# Patient Record
Sex: Male | Born: 1980 | Race: White | Hispanic: No | Marital: Married | Smoking: Never smoker
Health system: Southern US, Community
[De-identification: ages and names within clinical notes are randomized; demographics above are authoritative.]

## PROBLEM LIST (undated history)

## (undated) DIAGNOSIS — G932 Benign intracranial hypertension: Secondary | ICD-10-CM

## (undated) DIAGNOSIS — E782 Mixed hyperlipidemia: Secondary | ICD-10-CM

## (undated) DIAGNOSIS — I10 Essential (primary) hypertension: Secondary | ICD-10-CM

## (undated) DIAGNOSIS — I4891 Unspecified atrial fibrillation: Secondary | ICD-10-CM

## (undated) DIAGNOSIS — M25559 Pain in unspecified hip: Secondary | ICD-10-CM

## (undated) HISTORY — DX: Mixed hyperlipidemia: E78.2

## (undated) HISTORY — DX: Essential (primary) hypertension: I10

## (undated) HISTORY — DX: Benign intracranial hypertension: G93.2

## (undated) HISTORY — DX: Pain in unspecified hip: M25.559

## (undated) HISTORY — DX: Unspecified atrial fibrillation: I48.91

---

## 2010-10-28 ENCOUNTER — Emergency Department (HOSPITAL_COMMUNITY)
Admission: EM | Admit: 2010-10-28 | Discharge: 2010-10-28 | Payer: Self-pay | Source: Home / Self Care | Admitting: Emergency Medicine

## 2011-01-06 LAB — POCT I-STAT, CHEM 8
HCT: 50 % (ref 39.0–52.0)
Hemoglobin: 17 g/dL (ref 13.0–17.0)
Potassium: 4.3 mEq/L (ref 3.5–5.1)
Sodium: 138 mEq/L (ref 135–145)

## 2011-04-21 ENCOUNTER — Emergency Department (HOSPITAL_COMMUNITY)
Admission: EM | Admit: 2011-04-21 | Discharge: 2011-04-21 | Disposition: A | Discharge status: Home / Self Care | Payer: Managed Care, Other (non HMO) | Attending: Emergency Medicine | Admitting: Emergency Medicine

## 2011-04-21 DIAGNOSIS — R079 Chest pain, unspecified: Secondary | ICD-10-CM | POA: Insufficient documentation

## 2011-04-21 DIAGNOSIS — Z79899 Other long term (current) drug therapy: Secondary | ICD-10-CM | POA: Insufficient documentation

## 2011-04-21 DIAGNOSIS — Z7982 Long term (current) use of aspirin: Secondary | ICD-10-CM | POA: Insufficient documentation

## 2011-04-21 DIAGNOSIS — R000 Tachycardia, unspecified: Secondary | ICD-10-CM | POA: Insufficient documentation

## 2011-04-21 DIAGNOSIS — I4891 Unspecified atrial fibrillation: Secondary | ICD-10-CM | POA: Insufficient documentation

## 2011-04-21 DIAGNOSIS — R002 Palpitations: Secondary | ICD-10-CM | POA: Insufficient documentation

## 2014-02-20 ENCOUNTER — Encounter: Payer: Self-pay | Admitting: Neurology

## 2014-02-21 ENCOUNTER — Encounter: Payer: Self-pay | Admitting: Neurology

## 2014-02-21 ENCOUNTER — Encounter (INDEPENDENT_AMBULATORY_CARE_PROVIDER_SITE_OTHER): Payer: Self-pay

## 2014-02-21 ENCOUNTER — Ambulatory Visit (INDEPENDENT_AMBULATORY_CARE_PROVIDER_SITE_OTHER): Payer: BC Managed Care – PPO | Admitting: Neurology

## 2014-02-21 VITALS — BP 135/76 | HR 78 | Temp 98.7°F | Ht 70.5 in | Wt 290.0 lb

## 2014-02-21 DIAGNOSIS — R0683 Snoring: Secondary | ICD-10-CM

## 2014-02-21 DIAGNOSIS — M549 Dorsalgia, unspecified: Secondary | ICD-10-CM

## 2014-02-21 DIAGNOSIS — F40298 Other specified phobia: Secondary | ICD-10-CM

## 2014-02-21 DIAGNOSIS — E669 Obesity, unspecified: Secondary | ICD-10-CM

## 2014-02-21 DIAGNOSIS — G932 Benign intracranial hypertension: Secondary | ICD-10-CM

## 2014-02-21 DIAGNOSIS — R0609 Other forms of dyspnea: Secondary | ICD-10-CM

## 2014-02-21 DIAGNOSIS — R0989 Other specified symptoms and signs involving the circulatory and respiratory systems: Secondary | ICD-10-CM

## 2014-02-21 DIAGNOSIS — I4891 Unspecified atrial fibrillation: Secondary | ICD-10-CM

## 2014-02-21 DIAGNOSIS — I480 Paroxysmal atrial fibrillation: Secondary | ICD-10-CM

## 2014-02-21 DIAGNOSIS — H538 Other visual disturbances: Secondary | ICD-10-CM

## 2014-02-21 DIAGNOSIS — H4710 Unspecified papilledema: Secondary | ICD-10-CM

## 2014-02-21 DIAGNOSIS — F40240 Claustrophobia: Secondary | ICD-10-CM

## 2014-02-21 NOTE — Patient Instructions (Addendum)
You may have a condition called pseudotumor cerebri, which means that there increased fluid pressure around your brain. 1. We will add a orbital specific MRI with and without contrast. Down the road we may consider a MRV and MRA, but given a normal looking brain MRI, the concern low for any other issues.  2. You have already seen Dr. Reginal Lutesharles Richards.   3. We will do a LP with pressure testing and routine fluid testing.  4. We may consider a medication to help keep your spinal fluid pressure at bay.  The most serious complication of having pseudotumor cerebri is loss of vision which can be permanent. 5. Based on your symptoms and your exam I believe you are at risk for obstructive sleep apnea or OSA, and I think we should proceed with a sleep study to determine whether you do or do not have OSA and how severe it is. If you have more than mild OSA, I want you to consider treatment with CPAP. Please remember, the risks and ramifications of moderate to severe obstructive sleep apnea or OSA are: Cardiovascular disease, including congestive heart failure, stroke, difficult to control hypertension, arrhythmias, and even type 2 diabetes has been linked to untreated OSA. Sleep apnea causes disruption of sleep and sleep deprivation in most cases, which, in turn, can cause recurrent headaches, problems with memory, mood, concentration, focus, and vigilance. Most people with untreated sleep apnea report excessive daytime sleepiness, which can affect their ability to drive. Please do not drive if you feel sleepy.  I will see you back after your sleep study to go over all the test results and where to go from there. We will call you after your sleep study and to set up an appointment at the time.

## 2014-02-21 NOTE — Progress Notes (Signed)
Subjective:    Patient ID: Clayton Myers is a 33 y.o. male.  HPI    Huston Foley, MD, PhD [facility] [address], [address] [address] [place], [place] [address]  Dear Dr. Clovis Riley,   I saw your patient, Clayton Myers, upon your kind request in my neurologic clinic today for initial consultation of his recurrent headaches, concern for pseudotumor cerebri. The patient is unaccompanied today. As you know, Clayton Myers is a 33 year old right-handed gentleman with an underlying medical history of obesity, hypertension and a remote Hx of PAF, s/p L shoulder arthroscopic surgery in 10/14, who has been noted to have papilledema by Dr. Reginal Lutes, ophthalmologist, who then ordered a MRI brain. He had an MRI with and without contrast on 02/07/2014 which showed no definitive acute intracranial abnormality, mildly expanded empty sella which can be seen with idiopathic intracranial hypertension/pseudotumor cerebri. He states that during a recent routine eye exam he was told that he had a swollen ocular nerve on the left and was sent to Dr. Peggye Pitt for further evaluation.  He has had some blurry vision for the last 6 months, no loss of vision.  Never had TIA or stroke symptoms, denying sudden onset of one sided weakness, numbness, tingling, slurring of speech or droopy face, hearing loss, tinnitus, diplopia or visual field cut or monocular loss of vision, and denies recurrent headaches.   He does not wake with HAs. He was diagnosed with PAF in 7/11 and was hospitalized for about 5 days. He does not sleep well, in that he has trouble achieving and he has had some recent mid back pain. He did some extra yard work recently. He snores, but denies gasping for air. At the time of his PAF Dx, he was much heavier and was told to have a sleep study. He does have palpitations every few months. No Hx of CP, no FHx of OSA or Afib. He denies EDS, and his ESS is /24. His BT is 10-11 PM,  but it takes him about 1-2 hours to go to sleep. He has to get up at 5 AM and usually goes to the gym early morning.   His Past Medical History Is Significant For: Past Medical History  Diagnosis Date  . Essential hypertension, benign   . Atrial fibrillation   . Mixed hyperlipidemia   . Pain in joint, pelvic region and thigh     His Past Surgical History Is Significant For: No past surgical history on file.  His Family History Is Significant For: Family History  Problem Relation Age of Onset  . Cancer Paternal Grandfather   . Cancer Maternal Grandfather   . Osteoarthritis Father     His Social History Is Significant For: History   Social History  . Marital Status: Married    Spouse Name: N/A    Number of Children: N/A  . Years of Education: N/A   Social History Main Topics  . Smoking status: Never Smoker   . Smokeless tobacco: None  . Alcohol Use: No  . Drug Use: No  . Sexual Activity: None   Other Topics Concern  . None   Social History Narrative  . None    His Allergies Are:  No Known Allergies:   His Current Medications Are:  Outpatient Encounter Prescriptions as of 02/21/2014  Medication Sig  . aspirin 81 MG tablet Take 81 mg by mouth daily.  . metoprolol succinate (TOPROL-XL) 50 MG 24 hr tablet Take 50 mg by  mouth daily. Take with or immediately following a meal.  :   Review of Systems:  Out of a complete 14 point review of systems, all are reviewed and negative with the exception of these symptoms as listed below:  Review of Systems  Constitutional: Negative.   HENT: Positive for rhinorrhea and tinnitus.   Eyes: Positive for visual disturbance (blurred vision).  Respiratory: Negative.   Cardiovascular: Negative.   Gastrointestinal: Negative.   Endocrine: Negative.   Genitourinary: Negative.   Musculoskeletal: Positive for myalgias.  Skin: Negative.   Allergic/Immunologic: Positive for environmental allergies and immunocompromised state  (frequent infections).  Neurological: Positive for dizziness and numbness.       Memory loss  Hematological: Negative.   Psychiatric/Behavioral: Positive for sleep disturbance (e.d.s.).    Objective:  Neurologic Exam  Physical Exam Physical Examination:   Filed Vitals:   02/21/14 0832  BP: 135/76  Pulse: 78  Temp: 98.7 F (37.1 C)    General Examination: The patient is a very pleasant 33 y.o. male in no acute distress. He appears well-developed and well-nourished and well groomed.   HEENT: Normocephalic, atraumatic, pupils are equal, round and reactive to light and accommodation. Funduscopic exam shows possible mild papilledema on the R and mild papilledema on the L. Visual fields are full by finger perimetry. Corrected visual acuity with contacts in place was 20/20 on the right and 20/30 on the left. Extraocular tracking is good without limitation to gaze excursion or nystagmus noted. Normal smooth pursuit is noted. Hearing is grossly intact. Tympanic membranes are clear bilaterally. Face is symmetric with normal facial animation and normal facial sensation. Speech is clear with no dysarthria noted. There is no hypophonia. There is no lip, neck/head, jaw or voice tremor. Neck is supple with full range of passive and active motion. There are no carotid bruits on auscultation. Oropharynx exam reveals: mild mouth dryness, good dental hygiene and moderate airway crowding, due to narrow airway entry and tonsils in place. Mallampati is class I. Tongue protrudes centrally and palate elevates symmetrically. Tonsils are 2+ in size. Neck size is 19 inches.   Chest: Clear to auscultation without wheezing, rhonchi or crackles noted.  Heart: S1+S2+0, regular and normal without murmurs, rubs or gallops noted.   Abdomen: Soft, non-tender and non-distended with normal bowel sounds appreciated on auscultation.  Extremities: There is no pitting edema in the distal lower extremities bilaterally. Pedal  pulses are intact.  Skin: Warm and dry without trophic changes noted. There are no varicose veins.  Musculoskeletal: exam reveals no obvious joint deformities, tenderness or joint swelling or erythema.   Neurologically:  Mental status: The patient is awake, alert and oriented in all 4 spheres. His immediate and remote memory, attention, language skills and fund of knowledge are appropriate. There is no evidence of aphasia, agnosia, apraxia or anomia. Speech is clear with normal prosody and enunciation. Thought process is linear. Mood is normal and affect is normal.  Cranial nerves II - XII are as described above under HEENT exam. In addition: shoulder shrug is normal with equal shoulder height noted. Motor exam: Normal bulk, strength and tone is noted. There is no drift, tremor or rebound. Romberg is negative. Reflexes are 2+ throughout. Babinski: Toes are flexor bilaterally. Fine motor skills and coordination: intact with normal finger taps, normal hand movements, normal rapid alternating patting, normal foot taps and normal foot agility.  Cerebellar testing: No dysmetria or intention tremor on finger to nose testing. Heel to shin is unremarkable bilaterally.  There is no truncal or gait ataxia.  Sensory exam: intact to light touch, pinprick, vibration, temperature sense and proprioception in the upper and lower extremities.  Gait, station and balance: He stands with mild pain in the mid to lower back area reported. No veering to one side is noted. No leaning to one side is noted. Posture is age-appropriate and stance is narrow based. Gait shows normal stride length and normal pace. No problems turning are noted. He turns en bloc. Tandem walk is unremarkable. Intact toe and heel stance is noted.               Assessment and Plan:  In summary, Clayton Myers is a very pleasantLadona Myers 33 y.o.-year old male with a history of hypertension and obesity, who complains of blurry vision and was recently found to  have papilledema on the left. On examination he has a normal neurological exam with the exception of papilledema on the L and possibly to a milder degree on the R. His recent MRI brain with and without contrast showed a mildly expanded empty sella, a finding which can be seen in the context of pseudotumor cerebri. Given his constellation of physical findings, his symptoms, his imaging findings and his history there is indeed concerned that he has pseudotumor cerebri. Confirmation of increased intracranial pressure can only be obtained with a spinal tap with opening pressure measurement. I explained a lumbar puncture to him in detail and explained the test to the patient and his wife today. Otherwise, I think there is also concern for underlying sleep apnea not because he has a telltale history but because he reports snoring, has a larger next size, has a tighter airway, and has a history of PAF. At this moment, I would like for him to have a dedicated orbital MRI with and without contrast. I think we can hold off on MRV and MRA testing of his head because of a normal-looking MRI otherwise. I also think the concern for demyelinating disease is rather low given his otherwise benign history and again a normal-looking MRI with and without contrast. Sleep apnea has been linked into pseudotumor cerebri as you know. I would like for him to have a sleep study as well. We will schedule him for stress and I will see him back afterwards. Today also explained to him that there are symptomatic medications we can utilize down the road to help keep his spinal fluid pressure on the lower side or at bay. I talked to him about potentially using Diamox or Topamax in the near future. We will await the spinal fluid test results. Given his history of claustrophobia I provided him with some Xanax for his MRI testing and for his lumbar puncture test.   I answered all their questions today and the patient and his wife were in agreement  with the above outlined plan. He has an appointment with Dr. Peggye Pittichards coming up soon and may for recheck and I think he needs to be seen regularly to follow the progress of his eye exam.  Thank you very much for allowing me to participate in the care of this nice patient. If I can be of any further assistance to you please do not hesitate to call me at [phone].  Sincerely,   Huston FoleySaima Huxton Glaus, MD, PhD

## 2014-02-25 ENCOUNTER — Ambulatory Visit
Admission: RE | Admit: 2014-02-25 | Discharge: 2014-02-25 | Disposition: A | Discharge status: Home / Self Care | Payer: BC Managed Care – PPO | Source: Ambulatory Visit | Attending: Neurology | Admitting: Neurology

## 2014-02-25 DIAGNOSIS — R0683 Snoring: Secondary | ICD-10-CM

## 2014-02-25 DIAGNOSIS — G932 Benign intracranial hypertension: Secondary | ICD-10-CM

## 2014-02-25 DIAGNOSIS — E669 Obesity, unspecified: Secondary | ICD-10-CM

## 2014-02-25 DIAGNOSIS — R51 Headache: Secondary | ICD-10-CM

## 2014-02-25 DIAGNOSIS — M549 Dorsalgia, unspecified: Secondary | ICD-10-CM

## 2014-02-25 DIAGNOSIS — H4710 Unspecified papilledema: Secondary | ICD-10-CM

## 2014-02-25 DIAGNOSIS — I480 Paroxysmal atrial fibrillation: Secondary | ICD-10-CM

## 2014-02-25 DIAGNOSIS — H538 Other visual disturbances: Secondary | ICD-10-CM

## 2014-02-25 MED ORDER — GADOBENATE DIMEGLUMINE 529 MG/ML IV SOLN
20.0000 mL | Freq: Once | INTRAVENOUS | Status: AC | PRN
  Administered 2014-02-25: 20 mL via INTRAVENOUS

## 2014-02-28 ENCOUNTER — Telehealth: Payer: Self-pay | Admitting: *Deleted

## 2014-02-28 NOTE — Telephone Encounter (Signed)
Patient calling to get MRI results. Thanks °

## 2014-02-28 NOTE — Telephone Encounter (Addendum)
I called pt with results of MRI after Dr. Frances FurbishAthar consulted.  Pt scheduled for LP.  He verbalized understanding.

## 2014-02-28 NOTE — Telephone Encounter (Signed)
Patient calling again to ask about MRI results, please return call and advise.

## 2014-03-02 ENCOUNTER — Ambulatory Visit
Admission: RE | Admit: 2014-03-02 | Discharge: 2014-03-02 | Disposition: A | Discharge status: Home / Self Care | Payer: BC Managed Care – PPO | Source: Ambulatory Visit | Attending: Neurology | Admitting: Neurology

## 2014-03-02 VITALS — BP 117/66 | HR 58

## 2014-03-02 DIAGNOSIS — G932 Benign intracranial hypertension: Secondary | ICD-10-CM

## 2014-03-02 DIAGNOSIS — R0683 Snoring: Secondary | ICD-10-CM

## 2014-03-02 DIAGNOSIS — H538 Other visual disturbances: Secondary | ICD-10-CM

## 2014-03-02 DIAGNOSIS — E669 Obesity, unspecified: Secondary | ICD-10-CM

## 2014-03-02 DIAGNOSIS — I480 Paroxysmal atrial fibrillation: Secondary | ICD-10-CM

## 2014-03-02 DIAGNOSIS — H4710 Unspecified papilledema: Secondary | ICD-10-CM

## 2014-03-02 DIAGNOSIS — M549 Dorsalgia, unspecified: Secondary | ICD-10-CM

## 2014-03-02 LAB — CSF CELL COUNT WITH DIFFERENTIAL
RBC COUNT CSF: 0 uL
RBC Count, CSF: 0 cu mm
TUBE #: 1
Tube #: 1
WBC CSF: 0 uL (ref 0–5)
WBC CSF: 0 uL (ref 0–5)

## 2014-03-02 LAB — GLUCOSE, CSF: Glucose, CSF: 59 mg/dL (ref 43–76)

## 2014-03-02 LAB — CNS IGG SYNTHESIS RATE, CSF+BLOOD

## 2014-03-02 LAB — PROTEIN, CSF: TOTAL PROTEIN, CSF: 40 mg/dL (ref 15–45)

## 2014-03-02 MED ORDER — ACETAZOLAMIDE ER 500 MG PO CP12: ORAL_CAPSULE | ORAL | Status: DC

## 2014-03-02 NOTE — Discharge Instructions (Signed)

## 2014-03-02 NOTE — Progress Notes (Signed)
Quick Note:  Please advise patient that his lumbar puncture test results indeed indicate that his spinal fluid pressure is elevated. His preliminary spinal fluid test results otherwise are unremarkable. I would like for him to start medication to keep his spinal fluid pressure in the normal range if possible. We will start on Diamox (generic name: acetazolamide) 500 mg strength: Take one pill each night at bedtime for 1 week, then 1 pill twice daily thereafter. Common side effects reported are: Dizziness, lightheadedness, increase in urine output, blurry vision, dry mouth, drowsiness, loss of appetite, upset stomach, headache and tiredness, tingling in the hands and feet and change in taste, especially with carbonated sodas. Most side effects are transient especially during the first few days as the body adjusts to the medication.  Will send Rx to his pharmacy on file, thank you. Clayton FoleySaima Chloie Loney, MD, PhD [facility]    ______

## 2014-03-02 NOTE — Progress Notes (Addendum)
Two tiger-topped tubes blood drawn for LP labs from outer/posterior forearm on second attempt; site unremarkable.  First attempt by Victory Dakineb Kaiya Boatman, RN in left hand unremarkable.  Discharge instructions explained to patient and his wife.  Donell SievertJeanne Lohr, RN

## 2014-03-02 NOTE — Progress Notes (Signed)
Discharge instructions explained to pt and his wife. Pt resting quietly post procedure.

## 2014-03-03 LAB — MYELIN BASIC PROTEIN, CSF

## 2014-03-03 NOTE — Progress Notes (Signed)
Quick Note:  I called pt and relayed the results of CSF labs, some still pending. Instructed re: diamox as per above. Pt to call us as needed. Will be having sleep test, and then f/u appt to be made. ______

## 2014-03-05 LAB — CSF CULTURE W GRAM STAIN
Gram Stain: NONE SEEN
Gram Stain: NONE SEEN

## 2014-03-05 LAB — CSF CULTURE: ORGANISM ID, BACTERIA: NO GROWTH

## 2014-03-05 LAB — ANGIOTENSIN CONVERTING ENZYME, CSF: ACE, CSF: 4 U/L (ref <=15)

## 2014-03-08 LAB — MULTIPLE SCLEROSIS PANEL 2
ALBUMIN CSF MSPROF: 26 mg/dL (ref <35)
ALBUMIN MSPROF: 4520 mg/dL (ref 3700–5410)
ALBUMIN-INDEX: 5.8 (ref <9.0)
IGA TOTAL: 276 mg/dL (ref 81–463)
IGM-CSF: 0.07 mg/dL (ref <0.10)
IgA CSF: 0.51 mg/dL (ref 0.15–0.60)
IgG Total CSF: 4.1 mg/dL (ref 0.5–6.1)
IgG Total: 1269 mg/dL (ref 694–1618)
IgG-Index: 0.56 (ref <0.70)
IgG: <0.01 mg/dL (ref <0.10)
IgM Total: 105 mg/dL (ref 48–271)

## 2014-03-09 LAB — HSV(HERPES SMPLX VRS)ABS-I+II(IGG)-CSF

## 2014-03-13 ENCOUNTER — Telehealth: Payer: Self-pay | Admitting: Neurology

## 2014-03-13 NOTE — Telephone Encounter (Signed)
Please ask him if he can take the Diamox just at night for the next week or so and see if he can tolerate it that way. We may have to add a second drug or switch him to another drug if this does not work out.

## 2014-03-13 NOTE — Telephone Encounter (Signed)
States the acetaZOLAMIDE (DIAMOX SEQUELS) 500 MG capsule side effects have not went away. He also states he can not begin the regimen of taking it one in the morning and one at night because "it sucks the life out of him and he can not function." states he was advised if the side effects did not go away in a week to call and report it to the Dr.

## 2014-03-14 LAB — VIRAL CULTURE VIRC: ORGANISM ID, BACTERIA: NEGATIVE

## 2014-03-14 NOTE — Telephone Encounter (Signed)
I called pt and relayed the message below.  He stated that fatigue was sx with one tablet and then on 2 tabs he stated fatigue and also pins and needles.  I relayed that the drug could take 2 wks to get acclimated to it.  He will continue with only taking 1 tablet at night for the next week and see how it goes.  Will call back.

## 2014-03-24 ENCOUNTER — Encounter: Payer: Self-pay | Admitting: Neurology

## 2014-03-24 ENCOUNTER — Encounter (INDEPENDENT_AMBULATORY_CARE_PROVIDER_SITE_OTHER): Payer: Self-pay

## 2014-03-24 ENCOUNTER — Ambulatory Visit (INDEPENDENT_AMBULATORY_CARE_PROVIDER_SITE_OTHER): Payer: BC Managed Care – PPO | Admitting: Neurology

## 2014-03-24 VITALS — BP 114/72 | HR 62 | Temp 97.8°F | Ht 70.5 in | Wt 278.0 lb

## 2014-03-24 DIAGNOSIS — R0683 Snoring: Secondary | ICD-10-CM

## 2014-03-24 DIAGNOSIS — R0989 Other specified symptoms and signs involving the circulatory and respiratory systems: Secondary | ICD-10-CM

## 2014-03-24 DIAGNOSIS — G932 Benign intracranial hypertension: Secondary | ICD-10-CM

## 2014-03-24 DIAGNOSIS — H4710 Unspecified papilledema: Secondary | ICD-10-CM

## 2014-03-24 DIAGNOSIS — I480 Paroxysmal atrial fibrillation: Secondary | ICD-10-CM

## 2014-03-24 DIAGNOSIS — I4891 Unspecified atrial fibrillation: Secondary | ICD-10-CM

## 2014-03-24 DIAGNOSIS — R0609 Other forms of dyspnea: Secondary | ICD-10-CM

## 2014-03-24 DIAGNOSIS — E669 Obesity, unspecified: Secondary | ICD-10-CM

## 2014-03-24 DIAGNOSIS — M549 Dorsalgia, unspecified: Secondary | ICD-10-CM

## 2014-03-24 HISTORY — DX: Benign intracranial hypertension: G93.2

## 2014-03-24 MED ORDER — TOPIRAMATE 50 MG PO TABS: ORAL_TABLET | ORAL | Status: DC

## 2014-03-24 NOTE — Progress Notes (Addendum)
Subjective:    Patient ID: Clayton Myers is a 33 y.o. male.  HPI    Interim history:   Clayton Myers is a very pleasant 33 year old right-handed gentleman with an underlying medical history of obesity, hypertension and a remote Hx of PAF, s/p L shoulder arthroscopic surgery in 10/14, who presents for followup consultation of his recent diagnosis of pseudotumor cerebri. The patient is accompanied by his wife today. I first met him on 02/21/2014, at which time he reported a recent diagnosis of papilledema by his ophthalmologist Clayton Myers, who also had ordered a brain MRI (02/07/2014). This showed no definitive acute intracranial abnormality and a mildly expanded empty sella. I suggested he have a orbital MRI with and without contrast and considered a brain MRA and MRV but held off on those 2 tests. I also ordered a lumbar puncture for opening pressure measurement and a sleep study. 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 His lumbar puncture from 03/02/2014 showed an elevated opening pressure of 28, and a closing pressure of 12, and laboratory findings were unremarkable. We called him with the test results and I started him on Diamox 500 mg once daily with build up to twice daily. He called back earlier this month reporting side effects with the Diamox including fatigue and tingling. We reduced it to once daily at night only. He called and canceled his sleep study which was scheduled for 03/30/2014, reporting that he had a deductible that was too high and he could not afford a sleep study at this time.    Today, he reports that one week after his lumbar puncture he felt really well. He felt normal again. He started with Diamox and started having side effects including tingling and lethargy. When he went up to 2 doses a day he was too lethargic and cut back to  one. He has now restarted taking 2 pills daily about 5 days ago because he restarted having headaches and blurry vision. He is scheduled with his ophthalmologist next week. He is worried that his pressure has been building up again. He would be willing to schedule his sleep study. He is advised about his test results. We talked about potential side effects with Diamox and how the body does get tolerant to some of the side effects. He has noted no one-sided weakness or numbness or tingling but has intermittent tingling in his fingertips and toes. He noticed change in his taste and cannot tolerate any carbonated drinks. His headache has been intermittent and throbbing. Sometimes he has pulsatile pain behind his eyes. Sometimes when he moves his eyes it hurts. He has new eyeglasses.  He had reported, that during a recent routine eye exam he was told that he had a swollen ocular nerve on the left and was sent to Clayton Myers for further evaluation. He had noted blurry vision for about 6 months, no loss of vision. Never had TIA or stroke symptoms, denying sudden onset of one sided weakness, numbness, tingling, slurring of speech or droopy face, hearing loss, tinnitus, diplopia or visual field cut or monocular loss of vision, and denied recurrent headaches. He denies morning headaches. He was diagnosed with PAF in 7/11 and was hospitalized for about 5 days. He reported not sleeping well with problems going to sleep and staying asleep. He did report snoring. At the time of his PAF Dx,  he was much heavier and was told to have a sleep study. He does have palpitations every few months. No Hx of CP, no FHx of OSA or Afib. He denies EDS, and his ESS is /24. His BT is 10-11 PM, but it takes him about 1-2 hours to go to sleep. He has to get up at 5 AM and usually goes to the gym early morning.    His Past Medical History Is Significant For: Past Medical History  Diagnosis Date  . Essential hypertension, benign   . Atrial  fibrillation   . Mixed hyperlipidemia   . Pain in joint, pelvic region and thigh     His Past Surgical History Is Significant For: History reviewed. No pertinent past surgical history.  His Family History Is Significant For: Family History  Problem Relation Age of Onset  . Cancer Paternal Grandfather   . Cancer Maternal Grandfather   . Osteoarthritis Father     His Social History Is Significant For: History   Social History  . Marital Status: Married    Spouse Name: N/A    Number of Children: N/A  . Years of Education: N/A   Social History Main Topics  . Smoking status: Never Smoker   . Smokeless tobacco: None  . Alcohol Use: No  . Drug Use: No  . Sexual Activity: None   Other Topics Concern  . None   Social History Narrative  . None    His Allergies Are:  No Known Allergies:   His Current Medications Are:  Outpatient Encounter Prescriptions as of 03/24/2014  Medication Sig  . acetaZOLAMIDE (DIAMOX SEQUELS) 500 MG capsule 1 pill nightly at bedtime for 1 week, then 1 pill twice daily thereafter.  Marland Kitchen aspirin 81 MG tablet Take 81 mg by mouth daily.  . metoprolol succinate (TOPROL-XL) 50 MG 24 hr tablet Take 50 mg by mouth daily. Take with or immediately following a meal.  :  Review of Systems:  Out of a complete 14 point review of systems, all are reviewed and negative with the exception of these symptoms as listed below:  Review of Systems  Constitutional: Positive for fatigue.  HENT: Positive for tinnitus.   Eyes: Positive for photophobia, pain and visual disturbance (loss of vision).  Respiratory: Negative.   Cardiovascular: Negative.   Gastrointestinal: Negative.   Endocrine: Positive for polydipsia.  Genitourinary: Negative.   Musculoskeletal: Positive for back pain, neck pain and neck stiffness.  Skin: Negative.   Allergic/Immunologic: Negative.   Neurological: Positive for dizziness, speech difficulty, numbness and headaches.  Hematological:  Negative.   Psychiatric/Behavioral: Negative.     Objective:  Neurologic Exam  Physical Exam Physical Examination:   Filed Vitals:   03/24/14 1208  BP: 114/72  Pulse: 62  Temp: 97.8 F (36.6 C)    Visual Acuity Screening   Right eye Left eye Both eyes  Without correction:     With correction: 20/50 20/20    General Examination: The patient is a very pleasant 33 y.o. male in no acute distress. He appears well-developed and well-nourished and well groomed.   HEENT: Normocephalic, atraumatic, pupils are equal, round and reactive to light and accommodation. Funduscopic exam shows mild papilledema b/l.  Extraocular tracking is good without limitation to gaze excursion or nystagmus noted. Normal smooth pursuit is noted. Hearing is grossly intact. Face is symmetric with normal facial animation and normal facial sensation. Speech is clear with no dysarthria noted. There is no hypophonia. There is no lip, neck/head,  jaw or voice tremor. Neck is supple with full range of passive and active motion. There are no carotid bruits on auscultation. Oropharynx exam reveals: mild mouth dryness, good dental hygiene and moderate airway crowding, due to narrow airway entry and tonsils in place. Mallampati is class I. Tongue protrudes centrally and palate elevates symmetrically. Tonsils are 2+ in size.   Chest: Clear to auscultation without wheezing, rhonchi or crackles noted.  Heart: S1+S2+0, regular and normal without murmurs, rubs or gallops noted.   Abdomen: Soft, non-tender and non-distended with normal bowel sounds appreciated on auscultation.  Extremities: There is no pitting edema in the distal lower extremities bilaterally. Pedal pulses are intact.  Skin: Warm and dry without trophic changes noted. There are no varicose veins.  Musculoskeletal: exam reveals no obvious joint deformities, tenderness or joint swelling or erythema.   Neurologically:  Mental status: The patient is awake, alert  and oriented in all 4 spheres. His immediate and remote memory, attention, language skills and fund of knowledge are appropriate. There is no evidence of aphasia, agnosia, apraxia or anomia. Speech is clear with normal prosody and enunciation. Thought process is linear. Mood is normal and affect is normal.  Cranial nerves II - XII are as described above under HEENT exam. In addition: shoulder shrug is normal with equal shoulder height noted. Motor exam: Normal bulk, strength and tone is noted. There is no drift, tremor or rebound. Romberg is negative. Reflexes are 2+ throughout. Babinski: Toes are flexor bilaterally. Fine motor skills and coordination: intact with normal finger taps, normal hand movements, normal rapid alternating patting, normal foot taps and normal foot agility.  Cerebellar testing: No dysmetria or intention tremor on finger to nose testing. Heel to shin is unremarkable bilaterally. There is no truncal or gait ataxia.  Sensory exam: intact to light touch, pinprick, vibration, temperature sense in the upper and lower extremities.  Gait, station and balance: He stands with mild pain in the mid to lower back area reported. No veering to one side is noted. No leaning to one side is noted. Posture is age-appropriate and stance is narrow based. Gait shows normal stride length and normal pace. No problems turning are noted. He turns en bloc. Tandem walk is unremarkable. Intact toe and heel stance is noted.               Assessment and Plan:   In summary, KILO ESHELMAN is a very pleasant 33 year old male with a history of hypertension and obesity, who has been diagnosed with pseudotumor cerebri in the context of bilateral papilledema and lumbar puncture showing an opening pressure of 28, a closing pressure of 12. After the lumbar puncture he felt good. He is now experiencing recurrence of blurry vision and headache. He has increased his diamox to twice daily some 5 days ago. I think it is  time to introduce a second medication. We talked about adding Topamax next. I've given him a prescription and advised him to start it in about 3 or 4 days to give him time to get adjusted to the higher dose of Diamox. We talked about potential side effects with Topamax as well. He is advised to start with 50 mg strength half a pill at bedtime and gradually increase it to 2 pills each night. He is scheduled to see his ophthalmologist next week and is advised to make sure he gets a full eye background exam as well as visual field tests and a comment on whether there is  swelling of his eye nerves. He is furthermore advised to schedule a sleep study. Even though he does not have a telltale history of sleep apnea he is noted to snore some, he has an enlarged next size, and he is obese. We talked about other treatment options for pseudotumor cerebri such as optic nerve fenestration and placement of the shunt if need be as well as a repeat lumbar puncture if the need arises. He and his wife were in agreement with the plan. I suggested he see our nurse practitioner in about 2 months or sooner if the need arises, and I will see him back in 3-4 months. We again talked about his MRI results as well as his dedicated orbital MRI test results. His physical exam is otherwise nonfocal.    I answered all their questions today and the patient and his wife were in agreement with the above outlined plan. He has an appointment with Clayton Myers coming up soon and I think he needs to be seen regularly to follow the progress of his eye exam.

## 2014-03-24 NOTE — Patient Instructions (Addendum)
Continue diamox 500 mg twice daily.  Next week, Monday or Tuesday, around 03/28/14, start Topamax 50 mg: Take half a pill each bedtime for one week, then one pill at bedtime daily for one week, then 1-1/2 pills at bedtime daily for one week, then 2 pills at bedtime daily thereafter. Common side effects reported are: Sedation, sleepiness, tingling, change in taste especially with carbonated drinks, and rare side effects are glaucoma, kidney stones, and problems with thinking including word finding difficulties.  See Dr. Peggye Pitt next week for an updated eye exam and you will need visual acuity, peripheral vision field testing and looking for papilledema.   Schedule your sleep study on your way out.

## 2014-03-30 LAB — FUNGUS CULTURE W SMEAR: Smear Result: NONE SEEN

## 2014-04-13 ENCOUNTER — Telehealth: Payer: Self-pay | Admitting: Neurology

## 2014-04-13 NOTE — Telephone Encounter (Signed)
Wife said that patient the medication (Acetazolamide-Diamox Sequels) has not agreed with patient,has no patience is accusing her of stealing from him(this has never happened in the 8 years of their marriage). It started with the depression a few days after starting the medicine and the patient does not think that anything is wrong.

## 2014-04-13 NOTE — Telephone Encounter (Signed)
Patient's wife Leotis ShamesLauren calling to state that patient has some weird side effects from the new medication he was placed on about a month ago, patient is having extreme depression, crazy thoughts, and extreme headaches. Please return call to patient's wife and advise.

## 2014-04-13 NOTE — Telephone Encounter (Signed)
I had a lengthy conversation with the patient's wife in detail today on the phone. He has in the last few weeks exhibited changes in his personality and mood. He is very irritable and has had verbal aggression. He has had even mild delusions. He's been more depressed.I asked her to count the Diamox pills and the Topamax pills. The Topamax he has used 15 pills and the Diamox he has barely used since it was filled on 03/29/2014. It looks like he has stopped using Diamox but has at least been taking his Topamax. I believe that he is having psychiatric side effects from Topamax. While not common it is not unheard of to have abnormal personality changes, anger, aggression, irritability, and other mood related issues when on Topamax. At this juncture, he has to be taken off of it. I have advised his wife to tell him that I called and advised him to taper him off of Topamax but dropping half a pill every 2 days to a complete stop. He is furthermore going to have to be encouraged to take Diamox twice daily. He had a recent eye exam this month which showed stable I findings or perhaps improvement in his eye nerve swelling per wife's report and he has a followup in September which I asked her to make sure he keeps. He has not yet been scheduled for a sleep study and I will make sure we call them for that. She was in agreement with the plan. She was obviously distraught and I reassured her that there is a chance he is having medication induced mood changes which should improve after he is off of Topamax. She demonstrated understanding and agreement.

## 2014-04-14 ENCOUNTER — Telehealth: Payer: Self-pay | Admitting: Neurology

## 2014-04-14 NOTE — Telephone Encounter (Signed)
Patient calling to state that he had requested that his wife be removed from his HIPAA forms, they are in the process of getting a separation and he is concerned that the information that his soon to be ex wife had given to Dr. Frances FurbishAthar is not valid. Please return call to patient and advise.

## 2014-04-18 NOTE — Telephone Encounter (Signed)
Called pt and pt stated that he would like for his wife's name to be remove from his HIPAA forms, as they are in the process of getting a separation. I informed the pt that I would be giving this information to our medical records department. Please advise

## 2014-04-18 NOTE — Telephone Encounter (Signed)
Patient called and stated still waiting on return call.

## 2014-04-18 NOTE — Telephone Encounter (Signed)
Sent to Medical records

## 2014-04-18 NOTE — Telephone Encounter (Signed)
Mr Clayton Myers wanted to speak with Dr. Frances FurbishAthar regarding conversation she had with spouse.  He called on 6/16 requesting spouse to be removed from [employer] and as emergency contact.  Documented in patients demographics and call got through.  Patient really concerned about conversation with spouse due to pending separation and custody.

## 2014-05-25 ENCOUNTER — Encounter: Payer: Self-pay | Admitting: Neurology

## 2014-05-25 ENCOUNTER — Ambulatory Visit (INDEPENDENT_AMBULATORY_CARE_PROVIDER_SITE_OTHER): Payer: BC Managed Care – PPO | Admitting: Nurse Practitioner

## 2014-05-25 ENCOUNTER — Encounter: Payer: Self-pay | Admitting: Nurse Practitioner

## 2014-05-25 VITALS — BP 108/62 | HR 49 | Temp 98.7°F | Ht 71.0 in | Wt 255.0 lb

## 2014-05-25 DIAGNOSIS — E669 Obesity, unspecified: Secondary | ICD-10-CM

## 2014-05-25 DIAGNOSIS — G932 Benign intracranial hypertension: Secondary | ICD-10-CM

## 2014-05-25 DIAGNOSIS — H4710 Unspecified papilledema: Secondary | ICD-10-CM

## 2014-05-25 NOTE — Progress Notes (Addendum)
PATIENT: Clayton Myers DOB: 1981-03-28  REASON FOR VISIT: routine follow up for Psuedotumor cerebri HISTORY FROM: patient  HISTORY OF PRESENT ILLNESS: Clayton Myers is a very pleasant 33 year old right-handed gentleman with an underlying medical history of obesity, hypertension and a remote Hx of PAF, s/p L shoulder arthroscopic surgery in 10/14, who presents for followup consultation of his recent diagnosis of pseudotumor cerebri.   Update 05/25/14 (LL): Patient is alone today, he has seperated from his wife and has requested that she no longer receive information about him.  He filled out the change on his HIPPA form. His last visit, he has tolerated his medications well with no serious side effects. He is taking Diamox 500 mg twice a day and Topamax 100 mg at bedtime. He is continue to lose weight, he has lost a total of 70 pounds, he attributes the weight loss to running. He states that he has not had any blurred vision or headaches in the last 8 weeks. He saw his ophthalmologist about 6 weeks ago he stated that his left optic swelling was receiving. He has no new complaints.  03/24/14 (SA):  The patient is accompanied by his wife today. I first met him on 02/21/2014, at which time he reported a recent diagnosis of papilledema by his ophthalmologist Dr. Vernie Shanks, who also had ordered a brain MRI (02/07/2014). This showed no definitive acute intracranial abnormality and a mildly expanded empty sella. I suggested he have a orbital MRI with and without contrast and considered a brain MRA and MRV but held off on those 2 tests. I also ordered a lumbar puncture for opening pressure measurement and a sleep study. 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 His lumbar puncture from 03/02/2014 showed an elevated opening pressure of 28, and a closing pressure of 12, and  laboratory findings were unremarkable. We called him with the test results and I started him on Diamox 500 mg once daily with build up to twice daily. He called back earlier this month reporting side effects with the Diamox including fatigue and tingling. We reduced it to once daily at night only. He called and canceled his sleep study which was scheduled for 03/30/2014, reporting that he had a deductible that was too high and he could not afford a sleep study at this time.  Today, he reports that one week after his lumbar puncture he felt really well. He felt normal again. He started with Diamox and started having side effects including tingling and lethargy. When he went up to 2 doses a day he was too lethargic and cut back to one. He has now restarted taking 2 pills daily about 5 days ago because he restarted having headaches and blurry vision. He is scheduled with his ophthalmologist next week. He is worried that his pressure has been building up again. He would be willing to schedule his sleep study. He is advised about his test results. We talked about potential side effects with Diamox and how the body does get tolerant to some of the side effects. He has noted no one-sided weakness or numbness or tingling but has intermittent tingling in his fingertips and toes. He noticed change in his taste and cannot tolerate any carbonated drinks. His headache has been intermittent and throbbing. Sometimes he has pulsatile pain behind his eyes. Sometimes when he moves his eyes it hurts.  He has new eyeglasses.  He had reported, that during a recent routine eye exam he was told that he had a swollen ocular nerve on the left and was sent to Dr. Celesta Aver for further evaluation. He had noted blurry vision for about 6 months, no loss of vision. Never had TIA or stroke symptoms, denying sudden onset of one sided weakness, numbness, tingling, slurring of speech or droopy face, hearing loss, tinnitus, diplopia or visual field  cut or monocular loss of vision, and denied recurrent headaches. He denies morning headaches. He was diagnosed with PAF in 7/11 and was hospitalized for about 5 days. He reported not sleeping well with problems going to sleep and staying asleep. He did report snoring. At the time of his PAF Dx, he was much heavier and was told to have a sleep study. He does have palpitations every few months. No Hx of CP, no FHx of OSA or Afib. He denies EDS, and his ESS is /24. His BT is 10-11 PM, but it takes him about 1-2 hours to go to sleep. He has to get up at 5 AM and usually goes to the gym early morning.   REVIEW OF SYSTEMS: Full 14 system review of systems performed and notable only for: nothing, no complaints.   ALLERGIES: No Known Allergies  HOME MEDICATIONS: Outpatient Prescriptions Prior to Visit  Medication Sig Dispense Refill  . acetaZOLAMIDE (DIAMOX SEQUELS) 500 MG capsule 1 pill nightly at bedtime for 1 week, then 1 pill twice daily thereafter.  60 capsule  5  . metoprolol succinate (TOPROL-XL) 50 MG 24 hr tablet Take 50 mg by mouth daily. Take with or immediately following a meal.      . topiramate (TOPAMAX) 50 MG tablet 1/2 pill each bedtime x 1 week, then 1 pill nightly x 1 week, then 1-1/2 pills nightly x 1 week, then 2 pills nightly thereafter.  60 tablet  5  . aspirin 81 MG tablet Take 81 mg by mouth daily.       No facility-administered medications prior to visit.    PHYSICAL EXAM Filed Vitals:   05/25/14 0822  BP: 108/62  Pulse: 49  Temp: 98.7 F (37.1 C)  TempSrc: Oral  Height: _0  (1.803 m)  Weight: 255 lb (115.667 kg)   Body mass index is 35.58 kg/(m^2).  General Examination: The patient is a very pleasant 33 y.o. male in no acute distress. He appears well-developed and well-nourished and well groomed.  HEENT: Normocephalic, atraumatic, pupils are equal, round and reactive to light and accommodation. Funduscopic exam shows mild papilledema b/l.  Extraocular tracking  is good without limitation to gaze excursion or nystagmus noted. Normal smooth pursuit is noted. Hearing is grossly intact. Face is symmetric with normal facial animation and normal facial sensation. Speech is clear with no dysarthria noted. There is no hypophonia. There is no lip, neck/head, jaw or voice tremor. Neck is supple with full range of passive and active motion. There are no carotid bruits on auscultation. Oropharynx exam reveals: mild mouth dryness, good dental hygiene and moderate airway crowding, due to narrow airway entry and tonsils in place. Mallampati is class I. Tongue protrudes centrally and palate elevates symmetrically. Tonsils are 2+ in size.  Chest: Clear to auscultation without wheezing, rhonchi or crackles noted.  Heart: S1+S2+0, regular and normal without murmurs, rubs or gallops noted.  Abdomen: Soft, non-tender and non-distended with normal bowel sounds appreciated on auscultation.  Extremities: There is no pitting edema in the distal  lower extremities bilaterally. Pedal pulses are intact.  Skin: Warm and dry without trophic changes noted. There are no varicose veins.  Musculoskeletal: exam reveals no obvious joint deformities, tenderness or joint swelling or erythema.  Neurologically:  Mental status: The patient is awake, alert and oriented in all 4 spheres. His immediate and remote memory, attention, language skills and fund of knowledge are appropriate. There is no evidence of aphasia, agnosia, apraxia or anomia. Speech is clear with normal prosody and enunciation. Thought process is linear. Mood is normal and affect is normal.  Cranial nerves II - XII are as described above under HEENT exam. In addition: shoulder shrug is normal with equal shoulder height noted.  Motor exam: Normal bulk, strength and tone is noted. There is no drift, tremor or rebound. Romberg is negative. Reflexes are 2+ throughout. Babinski: Toes are flexor bilaterally. Fine motor skills and coordination:  intact with normal finger taps, normal hand movements, normal rapid alternating patting, normal foot taps and normal foot agility.  Cerebellar testing: No dysmetria or intention tremor on finger to nose testing. Heel to shin is unremarkable bilaterally. There is no truncal or gait ataxia.  Sensory exam: intact to light touch, pinprick, vibration, temperature sense in the upper and lower extremities.  Gait, station and balance: He stands with mild pain in the mid to lower back area reported. No veering to one side is noted. No leaning to one side is noted. Posture is age-appropriate and stance is narrow based. Gait shows normal stride length and normal pace. No problems turning are noted. He turns en bloc. Tandem walk is unremarkable. Intact toe and heel stance is noted.   ASSESSMENT: In summary, CLEVELAND YARBRO is a very pleasant 33 year old male with a history of hypertension and obesity, who has been diagnosed with pseudotumor cerebri in the context of bilateral papilledema and lumbar puncture showing an opening pressure of 28, a closing pressure of 12. He was not having any headaches. After the lumbar puncture he felt good.   He is now feeling well without headaches or blurry vision.  He is taking diamox twice daily.  He is also taking Topamax 50 mg, 2 pills each night. His last ophthalmologist visit was 6 weeks ago and was told the swelling of his eye nerves was receeding.   If he continues to loose weight, he may no longer need medications as the problem may take care of itself with weight loss.   He has lost a total of 70 lbs.  We talked about other treatment options for pseudotumor cerebri such as optic nerve fenestration and placement of the shunt if need be as well as a repeat lumbar puncture if the need arises.   His physical exam is otherwise nonfocal.   Last 3 Weights: 290, 278, 255 today (was 325 4 years ago)   PLAN: Continue current medications at current doses.  Dr. Rexene Alberts would like  to see him back in 3 months. I answered all their questions today and the patient was in agreement with the above outlined plan.  I think he needs to be seen regularly with Dr. Celesta Aver to follow the progress of his eye exam.   Rudi Rummage Enna Warwick, MSN, FNP-BC, A/GNP-C 05/25/2014, 8:32 AM [facility] [address] [place], [place] [address] [phone]  Note: This document was prepared with digital dictation and possible smart phrase technology. Any transcriptional errors that result from this process are unintentional.  I reviewed the above note and  documentation by the Nurse Practitioner and agree with the history, physical exam, assessment and plan as outlined above. Star Age, MD, PhD [facility]: Lambert Rhonda W)

## 2014-05-25 NOTE — Patient Instructions (Signed)
Continue current medications at current doses.    Please call the office with any questions or concerns.  Dr. Frances FurbishAthar would like to see you back in 3 months.  Pseudotumor Cerebri Pseudotumor cerebri, also called idiopathic intracranial hypertension, is a condition that occurs due to increased pressure within your skull. Although some of the symptoms resemble those of a brain tumor, it is not a brain tumor. Symptoms occur when the increased pressure in your skull compresses brain structures. For example, pressure on the nerve responsible for vision (optic nerve) causes it to swell, resulting in visual symptoms. Pseudotumor cerebri tends to occur in obese women younger than 32 years of age. However, men and children can also develop this condition. SYMPTOMS  Symptoms of pseudotumor cerebri occur due to increased pressure within the skull. Symptoms may include:   Headaches.  Nausea and vomiting.  Dizziness.  High blood pressure.   Ringing in the ears.  Double or blurred vision.  Brief episodes of complete loss of vision.  Pain in the back, neck, or shoulders. DIAGNOSIS  Pseudotumor cerebri is diagnosed through:  A detailed eye exam, which can reveal a swollen optic nerve, as well as identifying issues such as blind spots in the vision.  An MRI or CT scan to rule out other disorders that can cause similar symptoms, such as brain tumors.  A spinal tap (lumbar puncture), which can demonstrate increased pressure within the skull. TREATMENT  There are several ways that pseudotumor cerebri is treated, including:  Medicines to decrease the production of spinal fluid and lower the pressure within your skull.  Medicines to prevent or treat headaches.  Surgery to create an opening in your optic nerve to allow excess fluid to drain out.  Surgery to place drains (shunts) in your brain to remove excess fluid. HOME CARE INSTRUCTIONS   Take all medicines as directed by your health care  provider.  Go to all of your follow-up appointments.  Lose weight if you are overweight. SEEK MEDICAL CARE IF:  Any symptoms come back.  You develop trouble with hearing, vision, balance, or your sense of smell.  You cannot eat or drink what you need.  You are more weak or tired than usual.   You are losing weight without trying. SEEK IMMEDIATE MEDICAL CARE IF:  You have new symptoms such as vision problems or difficulty walking.   You have a seizure.   You have trouble breathing.   You have a fever.  Document Released: 10/18/2013 Document Reviewed: 10/18/2013 [facility] Patient Information 2015 [employer] [place]. This information is not intended to replace advice given to you by your health care provider. Make sure you discuss any questions you have with your health care provider.

## 2014-09-20 ENCOUNTER — Telehealth: Payer: Self-pay | Admitting: Neurology

## 2014-09-20 NOTE — Telephone Encounter (Signed)
Called to reschedule appt.from 11/16/14, template change.left VM message for call back

## 2014-11-16 ENCOUNTER — Telehealth: Payer: Self-pay | Admitting: Neurology

## 2014-11-16 ENCOUNTER — Ambulatory Visit: Payer: BC Managed Care – PPO | Admitting: Neurology

## 2014-11-16 NOTE — Telephone Encounter (Signed)
Patient is a no show for today's appointment at 11:30 11/16/14

## 2015-02-20 ENCOUNTER — Ambulatory Visit (INDEPENDENT_AMBULATORY_CARE_PROVIDER_SITE_OTHER): Payer: BC Managed Care – PPO | Admitting: Neurology

## 2015-02-20 ENCOUNTER — Encounter: Payer: Self-pay | Admitting: Neurology

## 2015-02-20 VITALS — BP 109/67 | HR 54 | Resp 18 | Ht 71.0 in | Wt 248.0 lb

## 2015-02-20 DIAGNOSIS — G932 Benign intracranial hypertension: Secondary | ICD-10-CM

## 2015-02-20 MED ORDER — TOPIRAMATE 50 MG PO TABS: ORAL_TABLET | ORAL | Status: DC

## 2015-02-20 NOTE — Patient Instructions (Signed)
1. We will re-start Topiramate for headaches.  2. Please schedule a follow up with Dr. Peggye Pittichards for a full eye exam to look for papilledema, swelling of the optic discs, which may indicate increase fluid pressure around the brain.  3. Your symptoms may be related to the Pseudotumor cerebri.  4. You may need another lumbar puncture/spinal tap to lower the fluid pressure.

## 2015-02-20 NOTE — Progress Notes (Signed)
Subjective:    Patient ID: Clayton Myers is a 34 y.o. male.  HPI     Interim history:  Mr. Clayton Myers is a 34 year old right-handed gentleman with an underlying medical history of obesity, hypertension and a remote Hx of PAF, s/p L shoulder arthroscopic surgery in 10/14, who presents for followup consultation of his pseudotumor cerebri. The patient is accompanied by his wife today. I last saw him on 03/24/2014, at which time the patient reported that one week after his lumbar puncture he felt really well, normal again. He started with Diamox and started having side effects including tingling and lethargy. When he went up to 2 doses a day he was too lethargic and cut back to one. He then restarted taking 2 pills daily, because he restarted having headaches and blurry vision. He was scheduled to see his ophthalmologist. He was willing to schedule his sleep study. He had new eyeglasses.  In the interim, his wife called on 04/13/2014 with concerns that he had exhibited some personality changes since starting Topamax. I suggested we taper him off of it.  He called the next day and requested that we take his wife off of the HIPPAA release form. They were going through a separation and divorce.  In the interim, he was seen by our nurse practitioner, Ms. Lam, on 05/25/2014, at which time he reported taking Diamox 500 mg twice daily and Topamax 100 mg each night with good tolerance. 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 He had no new complaints. He was also losing weight and lost about 70 pounds altogether. He was advised to continue with the same medications and regularly sees ophthalmologist.  Of note, he no showed for an appointment on 11/16/14.    Today, 02/20/2015: he reports that for the last month or so he has had nearly daily headaches. His headaches remind him from when he first got diagnosed with pseudotumor cerebri. Of note, he has been  working hard on weight loss. He has changed his job and had a change in his insurance. He has been able to lose weight. He felt fine for a while and actually ran out of his prescriptions. He took himself off of Diamox because he felt he had mood related side effects from it. When I talked to his wife on the phone in the past in June 2015 she had mentioned problems with the Topamax. He feels that it was actually for Diamox and he took himself off of it. Shortly after he saw Ms. Lam in July he ran out of Topamax and actually did not call for refill. He has not seen Dr. Celesta Aver in ophthalmology for a while. He does not notice any new blurry vision or double vision or any other one-sided weakness or numbness or tingling. He has had intermittent lightheadedness and one episode of vertigo. He has some lower back pain. He is due for a routine eye exam with his optometrist in the next couple weeks or so. He has been using his corrective glasses. He will get a new prescription for contacts at the time. He has a bifrontal headache typically. It feels like it is behind her eyes at times. His sleep is quite good. He does not snore. We have considered a sleep study in the past but he has been able to lose weight and currently does not have any concern for sleep-disordered breathing. He has lost about 30 pounds since May 2015.  Previously:  I first met him on 02/21/2014, at which time he reported a recent diagnosis of papilledema by his ophthalmologist Dr. Vernie Myers, who also had ordered a brain MRI (02/07/2014). This showed no definitive acute intracranial abnormality and a mildly expanded empty sella. I suggested he have a orbital MRI with and without contrast and considered a brain MRA and MRV but held off on those 2 tests. I also ordered a lumbar puncture for opening pressure measurement and a sleep study. 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 His lumbar puncture from 03/02/2014 showed an elevated opening pressure of 28, and a closing pressure of 12, and laboratory findings were unremarkable. We called him with the test results and I started him on Diamox 500 mg once daily with build up to twice daily. He called back earlier this month reporting side effects with the Diamox including fatigue and tingling. We reduced it to once daily at night only. He called and canceled his sleep study which was scheduled for 03/30/2014, reporting that he had a deductible that was too high and he could not afford a sleep study at this time.    He had reported, that during a recent routine eye exam he was told that he had a swollen ocular nerve on the left and was sent to Dr. Celesta Aver for further evaluation. He had noted blurry vision for about 6 months, no loss of vision. Never had TIA or stroke symptoms, denying sudden onset of one sided weakness, numbness, tingling, slurring of speech or droopy face, hearing loss, tinnitus, diplopia or visual field cut or monocular loss of vision, and denied recurrent headaches. He denies morning headaches. He was diagnosed with PAF in 7/11 and was hospitalized for about 5 days. He reported not sleeping well with problems going to sleep and staying asleep. He did report snoring. At the time of his PAF Dx, he was much heavier and was told to have a sleep study. He does have palpitations every few months. No Hx of CP, no FHx of OSA or Afib. He denies EDS, and his ESS is /24. His BT is 10-11 PM, but it takes him about 1-2 hours to go to sleep. He has to get up at 5 AM and usually goes to the gym early morning.  His Past Medical History Is Significant For: Past Medical History  Diagnosis Date  . Essential hypertension, benign   . Atrial fibrillation   . Mixed hyperlipidemia   . Pain in joint, pelvic region and thigh   . Pseudotumor cerebri 03/24/2014    His Past  Surgical History Is Significant For: No past surgical history on file.  His Family History Is Significant For: Family History  Problem Relation Age of Onset  . Cancer Paternal Grandfather   . Cancer Maternal Grandfather   . Osteoarthritis Father     His Social History Is Significant For: History   Social History  . Marital Status: Married    Spouse Name: Ander Purpura  . Number of Children: 1  . Years of Education: college   Occupational History  .      Production designer, [employer]   Social History Main Topics  . Smoking status: Never Smoker   . Smokeless tobacco: Never Used  . Alcohol Use: No  . Drug Use: No  . Sexual Activity: Not on file   Other Topics Concern  .  None   Social History Narrative   Patient lives at home with family.   Caffeine Use: 2 cups of coffee a day     His Allergies Are:  No Known Allergies:   His Current Medications Are:  Outpatient Encounter Prescriptions as of 02/20/2015  Medication Sig  . aspirin 81 MG tablet Take 81 mg by mouth daily.  . hyoscyamine (LEVSIN, ANASPAZ) 0.125 MG tablet Take 1 tablet by mouth as needed.  . metoprolol succinate (TOPROL-XL) 50 MG 24 hr tablet Take 50 mg by mouth daily. Take with or immediately following a meal.  . acetaZOLAMIDE (DIAMOX SEQUELS) 500 MG capsule 1 pill nightly at bedtime for 1 week, then 1 pill twice daily thereafter. (Patient not taking: Reported on 02/20/2015)  . topiramate (TOPAMAX) 50 MG tablet 1/2 pill each bedtime x 1 week, then 1 pill nightly x 1 week, then 1-1/2 pills nightly x 1 week, then 2 pills nightly thereafter. (Patient not taking: Reported on 02/20/2015)  :  Review of Systems:  Out of a complete 14 point review of systems, all are reviewed and negative with the exception of these symptoms as listed below:   Review of Systems  Neurological:       Headaches, back pain, dizziness     Objective:  Neurologic Exam  Physical Exam Physical Examination:   Filed Vitals:   02/20/15 1257   BP: 109/67  Pulse: 54  Resp: 18   General Examination: The patient is a very pleasant 34 y.o. male in no acute distress. He appears well-developed and well-nourished and well groomed.   HEENT: Normocephalic, atraumatic, pupils are equal, round and reactive to light and accommodation. Funduscopic exam shows on my exam no evidence of papilledema bilaterally. Visual fields are full by finger perimetry.   Extraocular tracking is good without limitation to gaze excursion or nystagmus noted. Normal smooth pursuit is noted. Hearing is grossly intact. Face is symmetric with normal facial animation and normal facial sensation. Speech is clear with no dysarthria noted. There is no hypophonia. There is no lip, neck/head, jaw or voice tremor. Neck is supple with full range of passive and active motion. There are no carotid bruits on auscultation. Oropharynx exam reveals: mild mouth dryness, good dental hygiene and moderate airway crowding, due to narrow airway entry and tonsils in place. Mallampati is class I. Tongue protrudes centrally and palate elevates symmetrically. Tonsils are 2+ in size.   Chest: Clear to auscultation without wheezing, rhonchi or crackles noted.  Heart: S1+S2+0, regular and normal without murmurs, rubs or gallops noted.   Abdomen: Soft, non-tender and non-distended with normal bowel sounds appreciated on auscultation.  Extremities: There is no pitting edema in the distal lower extremities bilaterally. Pedal pulses are intact.  Skin: Warm and dry without trophic changes noted. There are no varicose veins.  Musculoskeletal: exam reveals no obvious joint deformities, tenderness or joint swelling or erythema.   Neurologically:  Mental status: The patient is awake, alert and oriented in all 4 spheres. His immediate and remote memory, attention, language skills and fund of knowledge are appropriate. There is no evidence of aphasia, agnosia, apraxia or anomia. Speech is clear with normal  prosody and enunciation. Thought process is linear. Mood is normal and affect is normal.  Cranial nerves II - XII are as described above under HEENT exam. In addition: shoulder shrug is normal with equal shoulder height noted. Motor exam: Normal bulk, strength and tone is noted. There is no drift, tremor or rebound. Romberg is  negative. Reflexes are 2+ throughout. Babinski: Toes are flexor bilaterally. Fine motor skills and coordination: intact with normal finger taps, normal hand movements, normal rapid alternating patting, normal foot taps and normal foot agility.  Cerebellar testing: No dysmetria or intention tremor on finger to nose testing. Heel to shin is unremarkable bilaterally. There is no truncal or gait ataxia.  Sensory exam: intact to light touch, pinprick, vibration, temperature sense in the upper and lower extremities.  Gait, station and balance: He stands with mild pain in the mid to lower back area reported. No veering to one side is noted. No leaning to one side is noted. Posture is age-appropriate and stance is narrow based. Gait shows normal stride length and normal pace. No problems turning are noted. He turns en bloc. Tandem walk is unremarkable.           Assessment and Plan:   In summary, PORFIRIO BOLLIER is a very pleasant 34 year old male with a history of hypertension and obesity, who was diagnosed with pseudotumor cerebri. He had bilateral papilledema at the time. He had a lumbar puncture on 03/02/2014 which showed an opening pressure of 28, a closing pressure of 12. After the lumbar puncture he felt very good. He is now experiencing some recurrence of headache and has had some dizziness. He feels lightheaded sometimes upon standing. He is currently no longer on Diamox or Topamax. He felt that the Diamox was causing him psychiatric her mood related side effects. He felt the Topamax was more tolerable. At this juncture, I do not see telltale signs of papilledema but I suggested  strongly that he get a formal dilated eye exam with his ophthalmologist, Dr. Celesta Aver. He also has a routine eye test coming up to update his eye glasses and contacts. If he does have papilledema he will need another lumbar puncture to help reduce his fluid pressure. In the interim, I will go ahead and get him restarted on Topamax with titration. I provided him with a refill on his original prescription when we started him on 50 mg strength with titration. He is agreeable to this.  His exam is nonfocal. He is congratulated on his weight loss success. I will see him back in 3 months, sooner if needed.     I answered all their questions today and the patient and his wife were in agreement with the above outlined plan.

## 2015-05-25 ENCOUNTER — Ambulatory Visit: Payer: BC Managed Care – PPO | Admitting: Neurology

## 2015-08-20 ENCOUNTER — Other Ambulatory Visit: Payer: Self-pay | Admitting: Neurology

## 2016-07-08 ENCOUNTER — Ambulatory Visit (INDEPENDENT_AMBULATORY_CARE_PROVIDER_SITE_OTHER): Payer: BC Managed Care – PPO | Admitting: Podiatry

## 2016-07-08 ENCOUNTER — Ambulatory Visit (INDEPENDENT_AMBULATORY_CARE_PROVIDER_SITE_OTHER): Payer: BC Managed Care – PPO

## 2016-07-08 DIAGNOSIS — M722 Plantar fascial fibromatosis: Secondary | ICD-10-CM | POA: Diagnosis not present

## 2016-07-08 MED ORDER — MELOXICAM 15 MG PO TABS: 15.0000 mg | ORAL_TABLET | Freq: Every day | ORAL | 3 refills | Status: DC

## 2016-07-08 MED ORDER — METHYLPREDNISOLONE 4 MG PO TBPK: ORAL_TABLET | ORAL | 0 refills | Status: DC

## 2016-07-08 NOTE — Patient Instructions (Signed)

## 2016-07-08 NOTE — Progress Notes (Signed)
   Subjective:    Patient ID: Clayton Myers, male    DOB: 12/18/1980, 35 y.o.   MRN: [id number]  HPI: He presents today with a chief complaint of pain to the plantar and plantar medial aspect of the left heel. 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 Morning pain is particularly bad and has been taking ibuprofen on a regular basis H did alleviate some of the symptoms. He states that he's been more active with an increase in his running but he has since stopped with the heel pain.    Review of Systems  All other systems reviewed and are negative.      Objective:   Physical Exam: Vital signs are stable he is alert and oriented 3. Pulses are palpable. Neurologic sensorium is intact. Deep tendon reflex are intact muscle strength is normal symmetrical bilateral. Orthopedic evaluation demonstrates all joints distal to the ankle for range of motion without crepitation. He has pain on palpation medial calcaneal tubercle of the left heel. Radiographs 3 views of the left foot taken today do demonstrate a soft tissue increase in density at the plantar fascia calcaneal insertion site of the left heel. Skin does not show any open lesions or wounds.        Assessment & Plan:  Plantar fasciitis left.  Plan: I injected the left heel today with Kenalog and local anesthetic. Started him on a Medrol Dosepak to be followed by meloxicam. Placed in the plantar fascia brace to be followed by a night splint left foot. Discussed appropriate shoe gear stretching exercises ice therapy shoe gear modifications dispensed prescriptions as well as instructions for physical therapy. I will follow-up with him in 1 month.

## 2016-08-05 ENCOUNTER — Ambulatory Visit (INDEPENDENT_AMBULATORY_CARE_PROVIDER_SITE_OTHER): Payer: BC Managed Care – PPO | Admitting: Podiatry

## 2016-08-05 ENCOUNTER — Encounter: Payer: Self-pay | Admitting: Podiatry

## 2016-08-05 DIAGNOSIS — M722 Plantar fascial fibromatosis: Secondary | ICD-10-CM | POA: Diagnosis not present

## 2016-08-06 NOTE — Progress Notes (Signed)
He presents today for follow-up of his plantar fasciitis of his left foot. He states that is doing some better.  Objective: Vital signs are stable alert and oriented 3. Pulses are palpable. He still has pain on palpation medially located with the left heel.  Assessment: Plantar fasciitis nature left.  Plan: I reinjected the heel today but I also had him scanned for set of orthotics. He will continue all other conservative therapies including medication strapping bracing. Follow up with me once the orthotics arrive.

## 2016-09-02 ENCOUNTER — Encounter: Payer: Self-pay | Admitting: Podiatry

## 2016-09-02 ENCOUNTER — Ambulatory Visit (INDEPENDENT_AMBULATORY_CARE_PROVIDER_SITE_OTHER): Payer: BC Managed Care – PPO | Admitting: Podiatry

## 2016-09-02 DIAGNOSIS — M722 Plantar fascial fibromatosis: Secondary | ICD-10-CM | POA: Diagnosis not present

## 2016-09-02 NOTE — Progress Notes (Signed)
He presents today to pick up his orthotics. He was to wear them in his work boots. He states that his plantar fasciitis to his left heel is doing better but he still has achiness and he did complete the Medrol Dosepak. He states that he is about 90% improved.  Objective: Vital signs are stable alert and oriented 3 pulses are palpable. No pain on palpation medially continue tubercle of the left heel.  Assessment: Resolving plantar fasciitis 90% improved.  Plan: Distributed his orthotics to him today with both oral and written home-going instructions for care and use of the orthotics. I also recommended he follow up with me in 4-6 weeks. Should've questions or concerns she will notify us immediately.

## 2016-09-02 NOTE — Patient Instructions (Signed)

## 2016-10-02 ENCOUNTER — Ambulatory Visit: Payer: BC Managed Care – PPO | Admitting: Podiatry

## 2016-10-07 ENCOUNTER — Telehealth: Payer: Self-pay | Admitting: *Deleted

## 2016-10-07 NOTE — Telephone Encounter (Signed)
10/09/2016 appt is no longer available. 10/08/2016 at 3:30pm appt is currently available. I called pt again to see if he was interested in the 10/08/2016 appt. If pt calls back, and this appt is still available, please advise him of this.

## 2016-10-07 NOTE — Telephone Encounter (Signed)
Received call from patient requesting to see Dr Frances FurbishAthar; stated he thinks his pseudotumor is returning. He is having headaches, being treated by PCP for migraines. Scheduled him for first available which was in Feb and placed him on wait list. Will route to RN for her information.

## 2016-10-07 NOTE — Telephone Encounter (Signed)
I called pt. There is an available office visit on 10/09/2016 at 2:00pm with Dr. Frances FurbishAthar. I was going to offer pt this appt.  No answer, left a message asking pt to call me back. If pt calls back, please offer him that appt spot.

## 2016-10-08 ENCOUNTER — Encounter: Payer: Self-pay | Admitting: Neurology

## 2016-10-08 ENCOUNTER — Ambulatory Visit (INDEPENDENT_AMBULATORY_CARE_PROVIDER_SITE_OTHER): Payer: BC Managed Care – PPO | Admitting: Neurology

## 2016-10-08 VITALS — BP 129/81 | HR 72 | Resp 20 | Ht 70.0 in | Wt 240.0 lb

## 2016-10-08 DIAGNOSIS — H4710 Unspecified papilledema: Secondary | ICD-10-CM | POA: Diagnosis not present

## 2016-10-08 DIAGNOSIS — H579 Unspecified disorder of eye and adnexa: Secondary | ICD-10-CM

## 2016-10-08 DIAGNOSIS — G932 Benign intracranial hypertension: Secondary | ICD-10-CM

## 2016-10-08 DIAGNOSIS — H53483 Generalized contraction of visual field, bilateral: Secondary | ICD-10-CM | POA: Diagnosis not present

## 2016-10-08 MED ORDER — TOPIRAMATE 50 MG PO TABS: ORAL_TABLET | ORAL | 5 refills | Status: AC

## 2016-10-08 NOTE — Patient Instructions (Signed)
We are going to increase her Topamax gradually to 2 pills twice daily. Follow instructions on the prescription. We will also request a repeat lumbar puncture to make sure your pressure is not too high, if your pressure is elevated, they can relieve the fluid pressure around her brain by taking spinal fluid off. We will call you with a routine test results of your lumbar puncture. [facility]. I will refer you to ophthalmology. I believe you need a full dilated eye exam in visual field testing.

## 2016-10-08 NOTE — Telephone Encounter (Signed)
Pt called back and spoke to our phone staff. He is agreeable to a 3:30pm appt today. He has been scheduled.

## 2016-10-08 NOTE — Progress Notes (Signed)
Subjective:    Patient ID: Clayton Myers is a 35 y.o. male.  HPI     Interim history:   Clayton Myers is a 35 year old right-handed gentleman with an underlying medical history of obesity, hypertension and a remote Hx of PAF, s/p L shoulder arthroscopic surgery in 10/14, who presents for followup consultation of his pseudotumor cerebri and complaint of worsening headaches and increase in visual symptoms. The patient is unaccompanied today. Of note, he no showed for an appointment on 05/25/2015. I have not seen him in over 18 months. I last saw him on 02/20/2015, at which time he reported nearly daily headaches. He had lost weight. He was working very hard on weight loss. He had also changed his job. He did run out of his prescriptions and took himself off of Diamox because he felt he had mood related side effects from it. I explained to him that I had talked to his wife on the phone in the past in June 2015 and she felt that his mood related issues were coming from Topamax. He disagreed during the appointment and said it was the Diamox. He had not seen his ophthalmologist, Dr. Celesta Aver. He did not report any new blurry vision or double vision and had no other neurological symptoms other than recurrent headaches. His headaches were bifrontal and retro-orbital. He felt he was sleeping better and was not snoring. Since May 2015 he had lost about 30 pounds.  I asked him to restart Topamax for his headaches, schedule follow-up with his ophthalmologist, and we talked about the possible need for another lumbar puncture to check the fluid pressure on his brain.  Today, 10/08/2016: He reports that on 09/14/16 he was driving and suddenly had decrease in visual field, even tunnel vision for about less than a Minute, followed by symptoms of vertigo and pressure-like sensation in the front of his head bilaterally, different from previous sinus headaches. Since then, he has had recurrence of headaches and one other  episode about 2 weeks later of decrease in visual field, tunnel vision, which lasted again less than 1 minute. He had no recurrence of vertiginous symptoms but has a pressure-like sensation and fullness in the right ear and feeling of wetness in his right ear but no discharge. He has occasional ringing in his ears. He has not had a formal eye exam in over a year. His primary care physician has been prescribing the Topamax which is currently 100 mg at night and also tried him on Imitrex which he did not think helped. We had previously taken him off of Diamox for fear of side effects, we may consider this again in the near future.  Previously:   I saw him on 03/24/2014, at which time the patient reported that one week after his lumbar puncture he felt really well, normal again. He started with Diamox and started having side effects including tingling and lethargy. When he went up to 2 doses a day he was too lethargic and cut back to one. He then restarted taking 2 pills daily, because he restarted having headaches and blurry vision. He was scheduled to see his ophthalmologist. He was willing to schedule his sleep study. He had new eyeglasses.  In the interim, his wife called on 04/13/2014 with concerns that he had exhibited some personality changes since starting Topamax. I suggested we taper him off of it.  He called the next day and requested that we take his wife off of the HIPPAA release form. They  were going through a separation and divorce.   In the interim, he was seen by our nurse practitioner, Ms. Lam, on 05/25/2014, at which time he reported taking Diamox 500 mg twice daily and Topamax 100 mg each night with good tolerance. 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 He had no new complaints. He was also losing weight and lost about 70 pounds altogether. He was advised to continue with the same medications and regularly sees ophthalmologist.  Of  note, he no showed for an appointment on 11/16/14.     I first met him on 02/21/2014, at which time he reported a recent diagnosis of papilledema by his ophthalmologist Dr. Vernie Shanks, who also had ordered a brain MRI (02/07/2014). This showed no definitive acute intracranial abnormality and a mildly expanded empty sella. I suggested he have a orbital MRI with and without contrast and considered a brain MRA and MRV but held off on those 2 tests. I also ordered a lumbar puncture for opening pressure measurement and a sleep study. 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 His lumbar puncture from 03/02/2014 showed an elevated opening pressure of 28, and a closing pressure of 12, and laboratory findings were unremarkable. We called him with the test results and I started him on Diamox 500 mg once daily with build up to twice daily. He called back earlier this month reporting side effects with the Diamox including fatigue and tingling. We reduced it to once daily at night only. He called and canceled his sleep study which was scheduled for 03/30/2014, reporting that he had a deductible that was too high and he could not afford a sleep study at this time.    He had reported, that during a recent routine eye exam he was told that he had a swollen ocular nerve on the left and was sent to Dr. Celesta Aver for further evaluation. He had noted blurry vision for about 6 months, no loss of vision. Never had TIA or stroke symptoms, denying sudden onset of one sided weakness, numbness, tingling, slurring of speech or droopy face, hearing loss, tinnitus, diplopia or visual field cut or monocular loss of vision, and denied recurrent headaches. He denies morning headaches. He was diagnosed with PAF in 7/11 and was hospitalized for about 5 days. He reported not sleeping well with problems going to  sleep and staying asleep. He did report snoring. At the time of his PAF Dx, he was much heavier and was told to have a sleep study. He does have palpitations every few months. No Hx of CP, no FHx of OSA or Afib. He denies EDS, and his ESS is /24. His BT is 10-11 PM, but it takes him about 1-2 hours to go to sleep. He has to get up at 5 AM and usually goes to the gym early morning.   His Past Medical History Is Significant For: Past Medical History:  Diagnosis Date  . Atrial fibrillation ([place])   . Essential hypertension, benign   . Mixed hyperlipidemia   . Pain in joint, pelvic region and thigh   . Pseudotumor cerebri 03/24/2014    His Past Surgical History Is Significant For: No past surgical history on file.  His Family History Is Significant For: Family History  Problem Relation Age of Onset  . Osteoarthritis Father   .  Cancer Paternal Grandfather   . Cancer Maternal Grandfather     His Social History Is Significant For: Social History   Social History  . Marital status: Married    Spouse name: Clayton Myers  . Number of children: 1  . Years of education: college   Occupational History  .  [employer]   Social History Main Topics  . Smoking status: Never Smoker  . Smokeless tobacco: Never Used  . Alcohol use No  . Drug use: No  . Sexual activity: Not Asked   Other Topics Concern  . None   Social History Narrative   Patient lives at home with family.   Caffeine Use: 2 cups of coffee a day     His Allergies Are:  No Known Allergies:   His Current Medications Are:  Outpatient Encounter Prescriptions as of 10/08/2016  Medication Sig  . aspirin 81 MG tablet Take 81 mg by mouth daily.  . hyoscyamine (LEVSIN, ANASPAZ) 0.125 MG tablet Take 1 tablet by mouth as needed.  . metoprolol succinate (TOPROL-XL) 50 MG 24 hr tablet Take 50 mg by mouth daily. Take with or immediately following a meal.  . pantoprazole (PROTONIX) 40 MG tablet Take 40  mg by mouth daily.  . SUMAtriptan (IMITREX) 100 MG tablet Take 100 mg by mouth.  . topiramate (TOPAMAX) 50 MG tablet TAKE 1/2 TABLET BY MOUTH AT BEDTIME X1 WEEK, THEN 1 AT BEDTIME X1 WEEK, THEN 1 1/2 AT BEDTIME X1 WEEK, THEN 2 AT BEDTIME THEREAFTER  . [DISCONTINUED] acetaZOLAMIDE (DIAMOX SEQUELS) 500 MG capsule 1 pill nightly at bedtime for 1 week, then 1 pill twice daily thereafter.  . [DISCONTINUED] etodolac (LODINE) 400 MG tablet TK 1 T PO BID PRF PAIN  . [DISCONTINUED] meloxicam (MOBIC) 15 MG tablet Take 1 tablet (15 mg total) by mouth daily.   No facility-administered encounter medications on file as of 10/08/2016.   :  Review of Systems:  Out of a complete 14 point review of systems, all are reviewed and negative with the exception of these symptoms as listed below:  Review of Systems  Neurological: Positive for headaches.       Pt presents today for an evaluation of increasing frequency in headaches and visual disturbances. Pt reports that he was driving home on 47/65/46 and experienced severe tunnel vision with vertigo and immediate onset of a headache. Ever since that day, he has had a headache. Pt has experienced several episodes of visual disturbances. Pt says that Dr. Rexene Alberts instructed him to stop taking diamox because of side effects. Pt remains on topamax 100 mg qhs.    Objective:  Neurologic Exam  Physical Exam Physical Examination:   Vitals:   10/08/16 1534  BP: 129/81  Pulse: 72  Resp: 20   General Examination: The patient is a very pleasant 35 y.o. male in no acute distress. He appears well-developed and well-nourished and well groomed.   HEENT: Normocephalic, atraumatic, pupils are equal, round and reactive to light and accommodation. Funduscopic exam shows on my exam no evidence of papilledema bilaterally. Visual fields grossly full by finger perimetry.   Extraocular tracking is good without limitation to gaze excursion or nystagmus noted. Normal smooth pursuit is  noted. Hearing is grossly intact. Tympanic membranes are clear bilaterally, mild amount of wax on the right side, no impaction. Face is symmetric with normal facial animation and normal facial sensation. Speech is clear with no dysarthria noted. There is no hypophonia.  There is no lip, neck/head, jaw or voice tremor. Neck is supple with full range of passive and active motion. There are no carotid bruits on auscultation. Oropharynx exam reveals: mild mouth dryness, good dental hygiene and moderate airway crowding, due to narrow airway entry and tonsils in place. Mallampati is class I. Tongue protrudes centrally and palate elevates symmetrically. Tonsils are 2+ in size.   Chest: Clear to auscultation without wheezing, rhonchi or crackles noted.  Heart: S1+S2+0, regular and normal without murmurs, rubs or gallops noted.   Abdomen: Soft, non-tender and non-distended with normal bowel sounds appreciated on auscultation.  Extremities: There is no pitting edema in the distal lower extremities bilaterally. Pedal pulses are intact.  Skin: Warm and dry without trophic changes noted. There are no varicose veins.  Musculoskeletal: exam reveals no obvious joint deformities, tenderness or joint swelling or erythema.   Neurologically:  Mental status: The patient is awake, alert and oriented in all 4 spheres. His immediate and remote memory, attention, language skills and fund of knowledge are appropriate. There is no evidence of aphasia, agnosia, apraxia or anomia. Speech is clear with normal prosody and enunciation. Thought process is linear. Mood is normal and affect is normal.  Cranial nerves II - XII are as described above under HEENT exam. In addition: shoulder shrug is normal with equal shoulder height noted. Motor exam: Normal bulk, strength and tone is noted. There is no drift, tremor or rebound. Romberg is negative. Reflexes are 2+ throughout. Babinski: Toes are flexor bilaterally. Fine motor skills and  coordination: intact with normal finger taps, normal hand movements, normal rapid alternating patting, normal foot taps and normal foot agility.  Cerebellar testing: No dysmetria or intention tremor on finger to nose testing. Heel to shin is unremarkable bilaterally. There is no truncal or gait ataxia.  Sensory exam: intact to light touch in the upper and lower extremities.  Gait, station and balance: He stands with mild pain in the mid to lower back area reported. No veering to one side is noted. No leaning to one side is noted. Posture is age-appropriate and stance is narrow based. Gait shows normal stride length and normal pace. No problems turning are noted. Tandem walk is unremarkable.           Assessment and Plan:   In summary, Clayton Myers is a very pleasant 35 year old male with a history of hypertension and obesity, who was diagnosed with pseudotumor cerebri. He presents for follow-up consultation after a gap of over 18 months. 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 He had a lumbar puncture on 03/02/2014 which showed an opening pressure of 28, a closing pressure of 12. After the lumbar puncture he felt very good. He had experienced some recurrence of headache and has had some dizziness. He Originally was on Diamox but there was some concern of mood related side effects and we stopped it and he was started on Topamax with nighttime dosing, gradual titration, and currently at 100 mg at night. For the past month, he has had intermittent visual symptoms, recurrent frontal headaches, tinnitus, fullness in the right ear and one episode of vertigo. All in all, the constellation of symptoms, are suspicious for increase in intercranial pressure. I think we need to proceed with another spinal tap to get a feel for his spinal fluid pressure and try to reduce the pressure. In the interim, I would like to gradually increase his Topamax to 100 mg twice daily. Furthermore, he is  advised to see an  ophthalmologist. He would like to get a referral for this. I placed a referral to [facility] ophthalmology. We will call him with his lumbar puncture results. He can start increasing his Topamax at this time. He is strongly advised to follow through with the full eye exam and visual field testing. I will see him back in a couple months, sooner as needed. I answered all his questions today and he was in agreement. Thankfully, his exam is nonfocal. He is congratulated on his weight loss success. He lost about 50 pounds in the past 2-1/2 years.  I spent 25 minutes in total face-to-face time with the patient, more than 50% of which was spent in counseling and coordination of care, reviewing test results, reviewing medication and discussing or reviewing the diagnosis of PTC, its prognosis and treatment options.

## 2016-10-16 ENCOUNTER — Telehealth: Payer: Self-pay | Admitting: Neurology

## 2016-10-16 NOTE — Telephone Encounter (Signed)
Pt called to see if he is able to have a letter requesting a letter for Light duty at work and also " No inmate contact"  Pt stated he is having increase symptoms such as dizziness and continued headaches. Please call

## 2016-10-16 NOTE — Telephone Encounter (Signed)
I spoke to pt and advised him that Dr. Frances FurbishAthar recommends that we wait and see how things go after the LP, med adjustments, and eye evaluation. Dr. Frances FurbishAthar also wants pt to speak with his PCP about the letter for work/light duty. Pt verbalized understanding.

## 2016-10-16 NOTE — Telephone Encounter (Signed)
Please advise patient, that I would recommend, we wait out how things go with LP, recent med adjustment and ophthalm evaluation. In the meantime, he is encouraged to request letter from his PCP.

## 2016-10-16 NOTE — Telephone Encounter (Signed)
I called pt. 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 He is experiencing an increase in frequency and intensity of his headaches. Pt's tinnitus and vertigo has also increased. He has pain with certain eye movements such as reading. He is scheduled for his LP on 10/21/2016. He is asking for a letter from Dr. Frances FurbishAthar to take to his work so he will not have to call out sick so much. Pt explained that there are different posts that do not have contact with inmates and this is lighter duty but to get these certain posts, he needs a letter from the doctor asking for lighter duty and no inmate contact. This will allow him to be at work while he is experiencing these symptoms.  I advised pt that I would speak with Dr. Frances FurbishAthar and then call the pt back.

## 2016-10-21 ENCOUNTER — Ambulatory Visit
Admission: RE | Admit: 2016-10-21 | Discharge: 2016-10-21 | Disposition: A | Discharge status: Home / Self Care | Payer: BC Managed Care – PPO | Source: Ambulatory Visit | Attending: Neurology | Admitting: Neurology

## 2016-10-21 VITALS — BP 149/69 | HR 58

## 2016-10-21 DIAGNOSIS — G932 Benign intracranial hypertension: Secondary | ICD-10-CM

## 2016-10-21 DIAGNOSIS — H4710 Unspecified papilledema: Secondary | ICD-10-CM

## 2016-10-21 DIAGNOSIS — H53483 Generalized contraction of visual field, bilateral: Secondary | ICD-10-CM

## 2016-10-21 DIAGNOSIS — H579 Unspecified disorder of eye and adnexa: Secondary | ICD-10-CM

## 2016-10-21 LAB — CSF CELL COUNT WITH DIFFERENTIAL
RBC COUNT CSF: 2 {cells}/uL (ref 0–10)
WBC, CSF: 0 cells/uL (ref 0–5)

## 2016-10-21 LAB — GRAM STAIN: GRAM STAIN: NONE SEEN

## 2016-10-21 LAB — PROTEIN, CSF: Total Protein, CSF: 64 mg/dL — ABNORMAL HIGH (ref 15–45)

## 2016-10-21 LAB — GLUCOSE, CSF: Glucose, CSF: 66 mg/dL (ref 43–76)

## 2016-10-21 NOTE — Discharge Instructions (Signed)

## 2016-10-22 ENCOUNTER — Telehealth: Payer: Self-pay

## 2016-10-22 NOTE — Telephone Encounter (Signed)
-----   Message from Huston FoleySaima Athar, MD sent at 10/22/2016 12:17 PM EST ----- Recent LP showed mildly elevated pressure at 26 cm water pressure (in the past, he had an OP of 28 cm), so not as bad as before.  Proceed as planned with topiramate increase and ophth eval.  So fare test results on the CSF are benign, with the exception of mild increase in protein content, but this in isolation has no pathological consequence. Will keep him posted as to further test results on the spinal fluid, as some tests take longer to come back.  Please call patient to advise and that we will proceed as discussed.  Huston FoleySaima Athar, MD, PhD [facility]

## 2016-10-22 NOTE — Progress Notes (Signed)
Recent LP showed mildly elevated pressure at 26 cm water pressure (in the past, he had an OP of 28 cm), so not as bad as before.  Proceed as planned with topiramate increase and ophth eval.  So fare test results on the CSF are benign, with the exception of mild increase in protein content, but this in isolation has no pathological consequence. Will keep him posted as to further test results on the spinal fluid, as some tests take longer to come back.  Please call patient to advise and that we will proceed as discussed.  Huston FoleySaima Cristela Stalder, MD, PhD [facility]

## 2016-10-22 NOTE — Telephone Encounter (Signed)
Felling sore and shooting pains can be expected, as there are nerve endings "floating around" in the spinal fluid and sometimes, the nerves can briefly be touched during the procedure. This is not a sign of a serious complication. For low back soreness, try cooling pad or heat pad, whichever feels better, depends on the individual.  Please relay back to patient.

## 2016-10-22 NOTE — Telephone Encounter (Signed)
I spoke to Clayton Myers.  I advised him of his LP results. Clayton Myers says that he will increase the topiramate dosage as discussed and already has an opthalmology appt on 11/25/16.  Clayton Myers wants Dr. Frances FurbishAthar to know that yesterday during the LP the doctor "could not find the right spot" and was "digging around a little bit" but he felt "lightning bolts shoot down my leg" and is sore today. He wants to know if this is normal? He will also call [facility] and advise them of this.

## 2016-10-22 NOTE — Telephone Encounter (Signed)
I spoke to pt and advised him that soreness and shooting pains can be expected post LP. I advised him that Dr. Frances FurbishAthar recommends a heating or cooling pad, whichever feels best. Pt says that he will take it easy for the next day or so and will call us back if he is not feeling better.

## 2016-12-08 ENCOUNTER — Ambulatory Visit: Payer: Self-pay | Admitting: Neurology

## 2016-12-13 ENCOUNTER — Emergency Department (HOSPITAL_COMMUNITY)
Admission: EM | Admit: 2016-12-13 | Discharge: 2016-12-13 | Disposition: A | Discharge status: Home / Self Care | Payer: BC Managed Care – PPO | Attending: Emergency Medicine | Admitting: Emergency Medicine

## 2016-12-13 ENCOUNTER — Encounter (HOSPITAL_COMMUNITY): Payer: Self-pay | Admitting: Emergency Medicine

## 2016-12-13 DIAGNOSIS — G932 Benign intracranial hypertension: Secondary | ICD-10-CM

## 2016-12-13 DIAGNOSIS — Z79899 Other long term (current) drug therapy: Secondary | ICD-10-CM | POA: Insufficient documentation

## 2016-12-13 DIAGNOSIS — G971 Other reaction to spinal and lumbar puncture: Secondary | ICD-10-CM

## 2016-12-13 DIAGNOSIS — Z7982 Long term (current) use of aspirin: Secondary | ICD-10-CM | POA: Diagnosis not present

## 2016-12-13 DIAGNOSIS — R51 Headache: Secondary | ICD-10-CM | POA: Diagnosis present

## 2016-12-13 DIAGNOSIS — R519 Headache, unspecified: Secondary | ICD-10-CM

## 2016-12-13 MED ORDER — DIPHENHYDRAMINE HCL 50 MG/ML IJ SOLN
25.0000 mg | Freq: Once | INTRAMUSCULAR | Status: AC
  Administered 2016-12-13: 25 mg via INTRAVENOUS
  Filled 2016-12-13: qty 1

## 2016-12-13 MED ORDER — SODIUM CHLORIDE 0.9 % IV SOLN
1000.0000 mL | Freq: Once | INTRAVENOUS | Status: AC
  Administered 2016-12-13: 1000 mL via INTRAVENOUS

## 2016-12-13 MED ORDER — IBUPROFEN 600 MG PO TABS: 600.0000 mg | ORAL_TABLET | Freq: Four times a day (QID) | ORAL | 0 refills | Status: AC | PRN

## 2016-12-13 MED ORDER — SODIUM CHLORIDE 0.9 % IV SOLN
1000.0000 mL | INTRAVENOUS | Status: DC
  Administered 2016-12-13: 1000 mL via INTRAVENOUS

## 2016-12-13 MED ORDER — KETOROLAC TROMETHAMINE 30 MG/ML IJ SOLN
30.0000 mg | Freq: Once | INTRAMUSCULAR | Status: AC
  Administered 2016-12-13: 30 mg via INTRAVENOUS
  Filled 2016-12-13: qty 1

## 2016-12-13 MED ORDER — METOCLOPRAMIDE HCL 5 MG/ML IJ SOLN
10.0000 mg | Freq: Once | INTRAMUSCULAR | Status: AC
  Administered 2016-12-13: 10 mg via INTRAVENOUS
  Filled 2016-12-13: qty 2

## 2016-12-13 MED ORDER — METOCLOPRAMIDE HCL 10 MG PO TABS: 10.0000 mg | ORAL_TABLET | Freq: Four times a day (QID) | ORAL | 0 refills | Status: DC

## 2016-12-13 MED ORDER — DIPHENHYDRAMINE HCL 25 MG PO CAPS: 25.0000 mg | ORAL_CAPSULE | Freq: Four times a day (QID) | ORAL | 0 refills | Status: AC | PRN

## 2016-12-13 NOTE — ED Notes (Signed)
Pfeiffer MD at bedside 

## 2016-12-13 NOTE — ED Notes (Signed)
E-signature not working, pt verbalized understanding of DC instructions  

## 2016-12-13 NOTE — ED Provider Notes (Signed)
[facility]-EMERGENCY DEPT Provider Note   CSN: [id number] Arrival date & time: 12/13/16  1518     History   Chief Complaint Chief Complaint  Patient presents with  . Headache    HPI Clayton Myers is a 36 y.o. male.  HPI Patient has history of intracranial hypertension. He had a lumbar puncture done by his neurologist, Dr. Estella Husk, Monday (6 days ago). He reports his opening pressure was 27. He does not know what his pressure after the procedure was. He reports subsequent he had severe persistent headache. An abdominal event of frontal headache and behind his eyes. He rested and took medications as instructed by his treating provider, after 3 days he was still having severe headache that was limiting him from doing any regular activities. He was subsequently referred to get a blood patch. Despite this his headache persisted and he was still having to remain supine otherwise headache was severe. He reports his head just felt like there wasn't enough "cushion" in there anymore. Feeling forward, the headache was severe in his forehead, if you lie in his right side it was severe on the right side of his head and so if he was oriented to the left. The only position of comfort is completely supine. No nausea no vomiting. Weakness numbness tingling or gait incoordination. Past Medical History:  Diagnosis Date  . Atrial fibrillation (HCC)   . Essential hypertension, benign   . Mixed hyperlipidemia   . Pain in joint, pelvic region and thigh   . Pseudotumor cerebri 03/24/2014    Patient Active Problem List   Diagnosis Date Noted  . Pseudotumor cerebri 03/24/2014    History reviewed. No pertinent surgical history.     Home Medications    Prior to Admission medications   Medication Sig Start Date End Date Taking? Authorizing Provider  aspirin 81 MG tablet Take 81 mg by mouth daily.   Yes Historical Provider, MD  diazepam (VALIUM) 5 MG tablet Take 5 mg by mouth 3 (three) times daily as  needed for anxiety. 10/30/16  Yes Historical Provider, MD  hyoscyamine (LEVSIN, ANASPAZ) 0.125 MG tablet Take 1 tablet by mouth as needed. 03/25/14  Yes Historical Provider, MD  ibuprofen (ADVIL,MOTRIN) 200 MG tablet Take 800 mg by mouth every 6 (six) hours as needed for headache.   Yes Historical Provider, MD  metoprolol succinate (TOPROL-XL) 50 MG 24 hr tablet Take 50 mg by mouth daily. Take with or immediately following a meal.   Yes Historical Provider, MD  pantoprazole (PROTONIX) 40 MG tablet Take 40 mg by mouth daily. 09/25/16  Yes Historical Provider, MD  SUMAtriptan (IMITREX) 100 MG tablet Take 100 mg by mouth. 10/03/16  Yes Historical Provider, MD  Topiramate ER (TROKENDI XR) 200 MG CP24 Take 200 mg by mouth at bedtime.   Yes Historical Provider, MD  diphenhydrAMINE (BENADRYL) 25 mg capsule Take 1 capsule (25 mg total) by mouth every 6 (six) hours as needed. 12/13/16   Arby Barrette, MD  ibuprofen (ADVIL,MOTRIN) 600 MG tablet Take 1 tablet (600 mg total) by mouth every 6 (six) hours as needed. 12/13/16   Arby Barrette, MD  metoCLOPramide (REGLAN) 10 MG tablet Take 1 tablet (10 mg total) by mouth every 6 (six) hours. 12/13/16   Arby Barrette, MD  topiramate (TOPAMAX) 50 MG tablet Take 1 in AM and 2 at night for 2 weeks, the 2 pills 2 times daily thereafter. Patient not taking: Reported on 12/13/2016 10/08/16   Huston Foley, MD  Family History Family History  Problem Relation Age of Onset  . Osteoarthritis Father   . Cancer Paternal Grandfather   . Cancer Maternal Grandfather     Social History Social History  Substance Use Topics  . Smoking status: Never Smoker  . Smokeless tobacco: Never Used  . Alcohol use No     Allergies   Patient has no known allergies.   Review of Systems Review of Systems 10 Systems reviewed and are negative for acute change except as noted in the HPI.   Physical Exam Updated Vital Signs BP 106/69   Pulse (!) 58   Temp 98.7 F (37.1 C) (Oral)    Resp 14   Ht 5\' 10"  (1.778 m)   Wt 240 lb (108.9 kg)   SpO2 97%   BMI 34.44 kg/m   Physical Exam  Constitutional: He is oriented to person, place, and time. He appears well-developed and well-nourished.  HENT:  Head: Normocephalic and atraumatic.  Mouth/Throat: Oropharynx is clear and moist.  Eyes: Conjunctivae and EOM are normal. Pupils are equal, round, and reactive to light.  Neck: Neck supple.  Cardiovascular: Normal rate and regular rhythm.   No murmur heard. Pulmonary/Chest: Effort normal and breath sounds normal. No respiratory distress.  Abdominal: Soft. There is no tenderness.  Musculoskeletal: He exhibits no edema.  Neurological: He is alert and oriented to person, place, and time. No sensory deficit. He exhibits normal muscle tone. Coordination normal.  Skin: Skin is warm and dry.  Psychiatric: He has a normal mood and affect.  Nursing note and vitals reviewed.    ED Treatments / Results  Labs (all labs ordered are listed, but only abnormal results are displayed) Labs Reviewed - No data to display  EKG  EKG Interpretation None       Radiology No results found.  Procedures Procedures (including critical care time)  Medications Ordered in ED Medications  0.9 %  sodium chloride infusion (0 mLs Intravenous Stopped 12/13/16 2207)    Followed by  0.9 %  sodium chloride infusion (0 mLs Intravenous Stopped 12/13/16 2243)  metoCLOPramide (REGLAN) injection 10 mg (10 mg Intravenous Given 12/13/16 2033)  diphenhydrAMINE (BENADRYL) injection 25 mg (25 mg Intravenous Given 12/13/16 2033)  ketorolac (TORADOL) 30 MG/ML injection 30 mg (30 mg Intravenous Given 12/13/16 2033)     Initial Impression / Assessment and Plan / ED Course  I have reviewed the triage vital signs and the nursing notes.  Pertinent labs & imaging results that were available during my care of the patient were reviewed by me and considered in my medical decision making (see chart for details).     10:35 patient is significant improved with treatment with Benadryl, Reglan and Toradol and fluids. He has not been able to be up and ambulatory without having severe debilitating headache.  Final Clinical Impressions(s) / ED Diagnoses   Final diagnoses:  Headache disorder  Intracranial hypertension  Spinal headache   Patient is alert and nontoxic. He has no signs of infectious complications. He has no neurologic dysfunction. Pain was hydrated with a liter of fluids and given migraine cocktail of Toradol ibuprofen and Reglan. He did get significant improvement and was able to be up and ambulatory without severe pain. At this time, I do feel patient is stable for discharge. He is counseled to call his neurologist on Monday and advise him of his current condition. The signs and symptoms for return are reviewed New Prescriptions Discharge Medication List as of 12/13/2016 10:41  PM    START taking these medications   Details  diphenhydrAMINE (BENADRYL) 25 mg capsule Take 1 capsule (25 mg total) by mouth every 6 (six) hours as needed., Starting Sat 12/13/2016, Print    !! ibuprofen (ADVIL,MOTRIN) 600 MG tablet Take 1 tablet (600 mg total) by mouth every 6 (six) hours as needed., Starting Sat 12/13/2016, Print    metoCLOPramide (REGLAN) 10 MG tablet Take 1 tablet (10 mg total) by mouth every 6 (six) hours., Starting Sat 12/13/2016, Print     !! - Potential duplicate medications found. Please discuss with provider.       Arby Barrette, MD 12/13/16 2256

## 2016-12-13 NOTE — ED Triage Notes (Signed)
Pt. Stated, I have intercranial Hypertension. I had a LP on Monday and I laid flat for 24 hours. By Tuesday and Wednesday  My Headaches were horrible. On Thursday I went to my necrologist and I had a blood patch.  Take it easy on Friday to clot itself and heal itelf. Its been 2 days and my headache is no better.

## 2017-01-13 DIAGNOSIS — G932 Benign intracranial hypertension: Secondary | ICD-10-CM | POA: Insufficient documentation

## 2017-02-10 ENCOUNTER — Ambulatory Visit: Payer: BC Managed Care – PPO | Admitting: Neurology

## 2017-02-10 ENCOUNTER — Telehealth: Payer: Self-pay

## 2017-02-10 NOTE — Telephone Encounter (Signed)
I called pt to get his appt r/s since [facility] was closed the morning of 02/10/17. No answer, left a message asking him to call me back. If pt calls back, please get him scheduled for an OV with Dr. Frances Furbish.

## 2017-02-17 NOTE — Telephone Encounter (Signed)
I called pt again to get him rescheduled with Dr. Frances Furbish. No answer, left a message asking him to call me back. If pt calls back, please get him scheduled for an OV with Dr. Frances Furbish.

## 2017-03-26 NOTE — Telephone Encounter (Signed)
Sheena,  Can you please call this pt and r/s him from the missed appt in April due to the bad weather? He needs an office visit.  Thanks!

## 2017-03-26 NOTE — Telephone Encounter (Signed)
I called pt again to get him rescheduled for an OV with Dr. Frances FurbishAthar. No answer, left a message asking him to call me back to reschedule.

## 2017-03-26 NOTE — Telephone Encounter (Signed)
Noted, thanks for trying, if he calls back, please schedule him for an office visit.

## 2017-04-01 IMAGING — XA DG FLUORO GUIDE NDL PLC/BX
2 series · 2 of 2 positions shown · non-contrast
Comparison: none

CLINICAL DATA: Worsening headache and visual disturbance. Vertigo
and tinnitus. History of idiopathic intracranial hypertension.

[Series 1: ortho standard · 1 of 1 slices shown (1 of 2)]
[im 1/1]
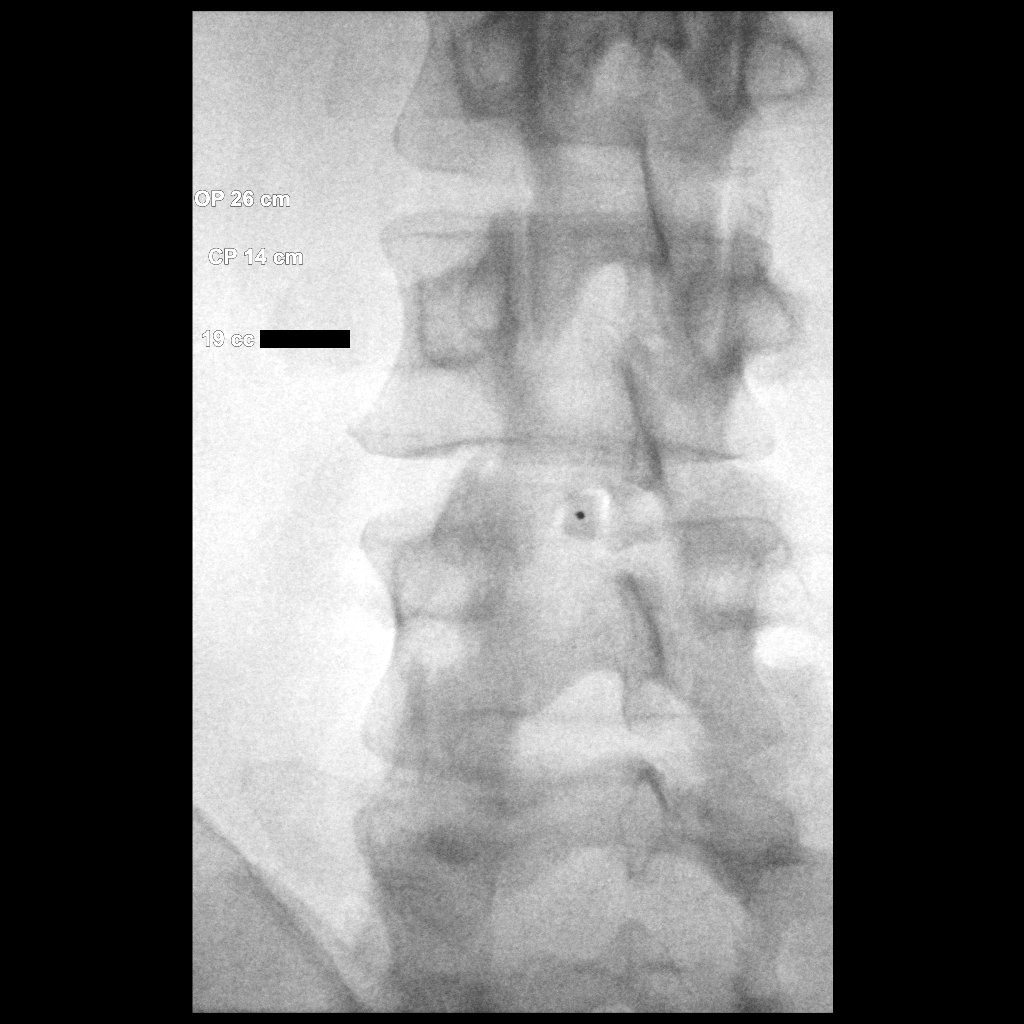

[Series 2: ortho standard · 1 of 1 slices shown (2 of 2)]
[im 1/1]
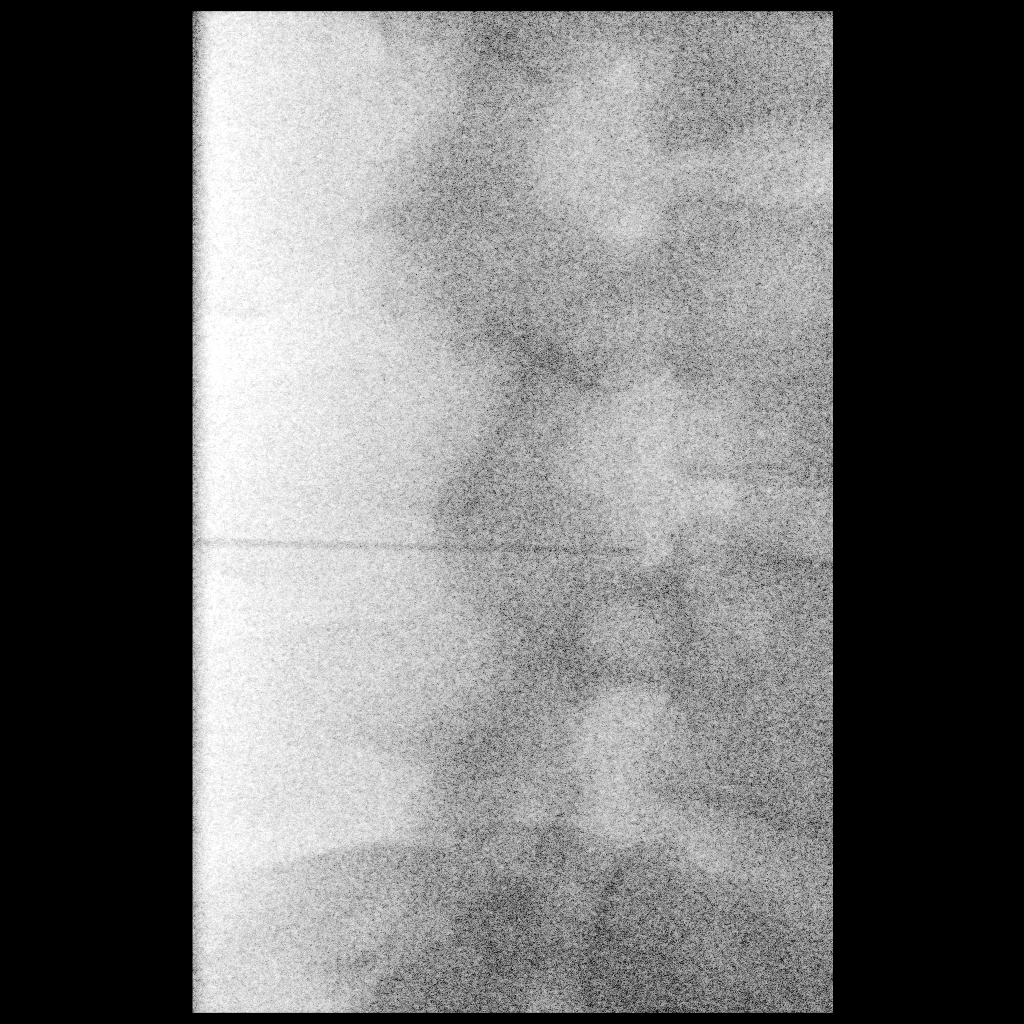

[2 of 2 positions shown; findings below may reference images not displayed]

EXAM:
DIAGNOSTIC LUMBAR PUNCTURE UNDER FLUOROSCOPIC GUIDANCE

FLUOROSCOPY TIME:  Fluoroscopy Time:  4 seconds

Radiation Exposure Index (if provided by the fluoroscopic device):
29.63 microGray*m^2

Number of Acquired Spot Images: 0

PROCEDURE:
Informed consent was obtained from the patient prior to the
procedure, including potential complications of headache, allergy,
and pain. With the patient prone, the lower back was prepped with
Betadine. 1% Lidocaine was used for local anesthesia. Lumbar
puncture was performed at the L3-4 level using a 3.5 inch 20 gauge
needle via a left paramedian approach with return of clear CSF with
an opening pressure of 26 cm water (measured in the left lateral
decubitus position). 19 ml of CSF were obtained for laboratory
studies. Closing pressure was 14 cm water. The patient tolerated the
procedure well and there were no apparent complications.
IMPRESSION: Successful fluoroscopic guided lumbar puncture. Elevated opening
pressure of 26 cm water.

## 2018-08-20 ENCOUNTER — Ambulatory Visit: Payer: BC Managed Care – PPO | Admitting: Podiatry

## 2018-08-20 ENCOUNTER — Encounter: Payer: Self-pay | Admitting: Podiatry

## 2018-08-20 DIAGNOSIS — L600 Ingrowing nail: Secondary | ICD-10-CM | POA: Diagnosis not present

## 2018-08-20 DIAGNOSIS — M79676 Pain in unspecified toe(s): Secondary | ICD-10-CM

## 2018-08-20 MED ORDER — NEOMYCIN-POLYMYXIN-HC 3.5-10000-1 OT SOLN: OTIC | 0 refills | Status: DC

## 2018-08-20 NOTE — Progress Notes (Signed)
2  Subjective:  Patient ID: Clayton Myers, male    DOB: Aug 09, 1981,  MRN: [id number]  Chief Complaint  Patient presents with  . Nail Problem    left foot great toe medial side; pt stated, "about a week ago, stubbed toe on kids toy and nail broke off; next night started getting sore and now looks infected"    37 y.o. male presents with the above complaint. Above history confirmed with patient.   Review of Systems: Negative except as noted in the HPI. Denies N/V/F/Ch.  Past Medical History:  Diagnosis Date  . Atrial fibrillation (HCC)   . Essential hypertension, benign   . Mixed hyperlipidemia   . Pain in joint, pelvic region and thigh   . Pseudotumor cerebri 03/24/2014    Current Outpatient Medications:  .  aspirin 81 MG tablet, Take 81 mg by mouth daily., Disp: , Rfl:  .  hyoscyamine (LEVSIN, ANASPAZ) 0.125 MG tablet, Take 1 tablet by mouth as needed., Disp: , Rfl:  .  ibuprofen (ADVIL,MOTRIN) 200 MG tablet, Take 800 mg by mouth every 6 (six) hours as needed for headache., Disp: , Rfl:  .  ibuprofen (ADVIL,MOTRIN) 600 MG tablet, Take 1 tablet (600 mg total) by mouth every 6 (six) hours as needed., Disp: 30 tablet, Rfl: 0 .  metoprolol succinate (TOPROL-XL) 50 MG 24 hr tablet, Take 50 mg by mouth daily. Take with or immediately following a meal., Disp: , Rfl:  .  pantoprazole (PROTONIX) 40 MG tablet, Take 40 mg by mouth daily., Disp: , Rfl:  .  topiramate (TOPAMAX) 50 MG tablet, Take 1 in AM and 2 at night for 2 weeks, the 2 pills 2 times daily thereafter., Disp: 120 tablet, Rfl: 5 .  Topiramate ER (TROKENDI XR) 200 MG CP24, Take 200 mg by mouth at bedtime., Disp: , Rfl:  .  diazepam (VALIUM) 5 MG tablet, Take 5 mg by mouth 3 (three) times daily as needed for anxiety., Disp: , Rfl:  .  diphenhydrAMINE (BENADRYL) 25 mg capsule, Take 1 capsule (25 mg total) by mouth every 6 (six) hours as needed., Disp: 30 capsule, Rfl: 0 .  metoCLOPramide (REGLAN) 10 MG tablet, Take 1 tablet (10 mg  total) by mouth every 6 (six) hours., Disp: 30 tablet, Rfl: 0 .  neomycin-polymyxin-hydrocortisone (CORTISPORIN) OTIC solution, Apply 2 drops to the ingrown toenail site twice daily. Cover with band-aid., Disp: 10 mL, Rfl: 0 .  SUMAtriptan (IMITREX) 100 MG tablet, Take 100 mg by mouth., Disp: , Rfl:   Social History   Tobacco Use  Smoking Status Never Smoker  Smokeless Tobacco Never Used    No Known Allergies Objective:  There were no vitals filed for this visit. There is no height or weight on file to calculate BMI. Constitutional Well developed. Well nourished.  Vascular Dorsalis pedis pulses palpable bilaterally. Posterior tibial pulses palpable bilaterally. Capillary refill normal to all digits.  No cyanosis or clubbing noted. Pedal hair growth normal.  Neurologic Normal speech. Oriented to person, place, and time. Epicritic sensation to light touch grossly present bilaterally.  Dermatologic Painful ingrowing nail at medial nail borders of the hallux nail left. No other open wounds. No skin lesions.  Orthopedic: Normal joint ROM without pain or crepitus bilaterally. No visible deformities. No bony tenderness.   Radiographs: None Assessment:   1. Ingrown nail   2. Pain around toenail    Plan:  Patient was evaluated and treated and all questions answered.  Ingrown Nail, left -Patient elects  to proceed with minor surgery to remove ingrown toenail removal today. Consent reviewed and signed by patient. -Ingrown nail excised. See procedure note. -Educated on post-procedure care including soaking. Written instructions provided and reviewed. -Patient to follow up in 2 weeks for nail check. -Rx cortisporin  Procedure: Excision of Ingrown Toenail Location: Left 1st medial nail borders. Anesthesia: Lidocaine 1% plain; 1.5 mL and Marcaine 0.5% plain; 1.5 mL, digital block. Skin Prep: Betadine. Dressing: Silvadene; telfa; dry, sterile, compression dressing. Technique:  Following skin prep, the toe was exsanguinated and a tourniquet was secured at the base of the toe. The affected nail border was freed, split with a nail splitter, and excised. Chemical matrixectomy was then performed with phenol and irrigated out with alcohol. The tourniquet was then removed and sterile dressing applied. Disposition: Patient tolerated procedure well. Patient to return in 2 weeks for follow-up.   Return in about 2 weeks (around 09/03/2018) for Nail Check.

## 2018-08-20 NOTE — Patient Instructions (Signed)

## 2018-09-03 ENCOUNTER — Other Ambulatory Visit: Payer: BC Managed Care – PPO

## 2019-02-17 ENCOUNTER — Encounter: Payer: Self-pay | Admitting: Podiatry

## 2019-02-17 ENCOUNTER — Ambulatory Visit (INDEPENDENT_AMBULATORY_CARE_PROVIDER_SITE_OTHER): Payer: BC Managed Care – PPO

## 2019-02-17 ENCOUNTER — Other Ambulatory Visit: Payer: Self-pay

## 2019-02-17 ENCOUNTER — Ambulatory Visit: Payer: BC Managed Care – PPO | Admitting: Podiatry

## 2019-02-17 DIAGNOSIS — M25571 Pain in right ankle and joints of right foot: Secondary | ICD-10-CM

## 2019-02-17 DIAGNOSIS — M7751 Other enthesopathy of right foot: Secondary | ICD-10-CM

## 2019-02-17 DIAGNOSIS — S99911A Unspecified injury of right ankle, initial encounter: Secondary | ICD-10-CM

## 2019-02-17 DIAGNOSIS — S93491A Sprain of other ligament of right ankle, initial encounter: Secondary | ICD-10-CM

## 2019-02-17 DIAGNOSIS — M25471 Effusion, right ankle: Secondary | ICD-10-CM | POA: Diagnosis not present

## 2019-02-17 DIAGNOSIS — M779 Enthesopathy, unspecified: Secondary | ICD-10-CM

## 2019-02-17 MED ORDER — MELOXICAM 15 MG PO TABS: 15.0000 mg | ORAL_TABLET | Freq: Every day | ORAL | 0 refills | Status: AC

## 2019-02-17 NOTE — Progress Notes (Signed)
Subjective:  Patient ID: Clayton Myers, male    DOB: 05/06/1981,  MRN: [id number]  Chief Complaint  Patient presents with  . Foot Pain    "My foot started hurting on Tuesday on the top and the side of my foot.  I stepped on my kid's toy on Wednesday and it started hurting really bad.  I been taking Tylenol and Ibuprofen for the pain.  It hurts to put weight on it without it.  I've tried to keep it elevated and I've iced it.  There's no swelling that I can tell."    38 y.o. male presents with the above complaint. History above confirmed with patient.  Review of Systems: Negative except as noted in the HPI. Denies N/V/F/Ch.  Past Medical History:  Diagnosis Date  . Atrial fibrillation (HCC)   . Essential hypertension, benign   . Mixed hyperlipidemia   . Pain in joint, pelvic region and thigh   . Pseudotumor cerebri 03/24/2014    Current Outpatient Medications:  .  aspirin 81 MG tablet, Take 81 mg by mouth daily., Disp: , Rfl:  .  diazepam (VALIUM) 5 MG tablet, Take 5 mg by mouth 3 (three) times daily as needed for anxiety., Disp: , Rfl:  .  diphenhydrAMINE (BENADRYL) 25 mg capsule, Take 1 capsule (25 mg total) by mouth every 6 (six) hours as needed., Disp: 30 capsule, Rfl: 0 .  hyoscyamine (LEVSIN, ANASPAZ) 0.125 MG tablet, Take 1 tablet by mouth as needed., Disp: , Rfl:  .  ibuprofen (ADVIL,MOTRIN) 200 MG tablet, Take 800 mg by mouth every 6 (six) hours as needed for headache., Disp: , Rfl:  .  ibuprofen (ADVIL,MOTRIN) 600 MG tablet, Take 1 tablet (600 mg total) by mouth every 6 (six) hours as needed., Disp: 30 tablet, Rfl: 0 .  meloxicam (MOBIC) 15 MG tablet, Take 1 tablet (15 mg total) by mouth daily., Disp: 30 tablet, Rfl: 0 .  metoCLOPramide (REGLAN) 10 MG tablet, Take 1 tablet (10 mg total) by mouth every 6 (six) hours., Disp: 30 tablet, Rfl: 0 .  metoprolol succinate (TOPROL-XL) 50 MG 24 hr tablet, Take 50 mg by mouth daily. Take with or immediately following a meal., Disp: ,  Rfl:  .  neomycin-polymyxin-hydrocortisone (CORTISPORIN) OTIC solution, Apply 2 drops to the ingrown toenail site twice daily. Cover with band-aid., Disp: 10 mL, Rfl: 0 .  pantoprazole (PROTONIX) 40 MG tablet, Take 40 mg by mouth daily., Disp: , Rfl:  .  SUMAtriptan (IMITREX) 100 MG tablet, Take 100 mg by mouth., Disp: , Rfl:  .  topiramate (TOPAMAX) 50 MG tablet, Take 1 in AM and 2 at night for 2 weeks, the 2 pills 2 times daily thereafter., Disp: 120 tablet, Rfl: 5 .  Topiramate ER (TROKENDI XR) 200 MG CP24, Take 200 mg by mouth at bedtime., Disp: , Rfl:   Social History   Tobacco Use  Smoking Status Never Smoker  Smokeless Tobacco Never Used    No Known Allergies Objective:  There were no vitals filed for this visit. There is no height or weight on file to calculate BMI. Constitutional Well developed. Well nourished.  Vascular Dorsalis pedis pulses palpable bilaterally. Posterior tibial pulses palpable bilaterally. Capillary refill normal to all digits.  No cyanosis or clubbing noted. Pedal hair growth normal.  Neurologic Normal speech. Oriented to person, place, and time. Epicritic sensation to light touch grossly present bilaterally.  Dermatologic Nails well groomed and normal in appearance. No open wounds. No skin lesions. Ingrown  nail well healed left hallux.  Orthopedic: POP R ATFL. POP R cuboid. Slight local edema. Prominent EDB muscle belly. Hammertoes left foot.   Radiographs: Taken and reviewed. No acute fracture or dislocation. Hammertoe contractures lesser digit. Assessment:   1. Sprain of anterior talofibular ligament of right ankle, initial encounter   2. Acute right ankle pain   3. Right ankle injury, initial encounter   4. Swelling of right ankle joint    Plan:  Patient was evaluated and treated and all questions answered.  R Ankle Sprain, Edema -XR as above -Dispense trilock. Educated on use. -Immobilize for 2 weeks in Trilock brace -Rx Meloxicam  for pain and inflammation.  Return in about 4 weeks (around 03/17/2019) for Right ankle sprain f/u .

## 2019-03-17 ENCOUNTER — Ambulatory Visit: Payer: BC Managed Care – PPO | Admitting: Podiatry

## 2019-04-28 ENCOUNTER — Ambulatory Visit (INDEPENDENT_AMBULATORY_CARE_PROVIDER_SITE_OTHER): Payer: BC Managed Care – PPO

## 2019-04-28 ENCOUNTER — Other Ambulatory Visit: Payer: Self-pay | Admitting: Sports Medicine

## 2019-04-28 ENCOUNTER — Encounter: Payer: Self-pay | Admitting: Sports Medicine

## 2019-04-28 ENCOUNTER — Ambulatory Visit: Payer: BC Managed Care – PPO | Admitting: Sports Medicine

## 2019-04-28 ENCOUNTER — Other Ambulatory Visit: Payer: Self-pay

## 2019-04-28 DIAGNOSIS — M205X2 Other deformities of toe(s) (acquired), left foot: Secondary | ICD-10-CM

## 2019-04-28 DIAGNOSIS — M779 Enthesopathy, unspecified: Secondary | ICD-10-CM

## 2019-04-28 DIAGNOSIS — M79672 Pain in left foot: Secondary | ICD-10-CM

## 2019-04-28 DIAGNOSIS — M25572 Pain in left ankle and joints of left foot: Secondary | ICD-10-CM | POA: Diagnosis not present

## 2019-04-28 DIAGNOSIS — M7752 Other enthesopathy of left foot: Secondary | ICD-10-CM

## 2019-04-28 DIAGNOSIS — M7742 Metatarsalgia, left foot: Secondary | ICD-10-CM

## 2019-04-28 MED ORDER — TRIAMCINOLONE ACETONIDE 10 MG/ML IJ SUSP: 10.0000 mg | Freq: Once | INTRAMUSCULAR | Status: DC

## 2019-04-28 MED ORDER — TRIAMCINOLONE ACETONIDE 10 MG/ML IJ SUSP
10.0000 mg | Freq: Once | INTRAMUSCULAR | Status: AC
  Administered 2019-04-28: 10 mg

## 2019-04-28 NOTE — Progress Notes (Signed)
Subjective: Clayton Myers is a 38 y.o. male patient who presents to office for evaluation of Left foot pain. Patient complains of progressive pain especially over the last week at the great toe on left. Ranks pain 4/10 sharp constant pain worse after 12 hour shift and is now interferring with daily activities. Patient has tried Mobic with no relief in symptoms. Patient denies any other pedal complaints. Denies injury/trip/fall/sprain/any causative factors.   Review of Systems  Musculoskeletal: Positive for joint pain.  All other systems reviewed and are negative.    Patient Active Problem List   Diagnosis Date Noted  . Pseudotumor cerebri 03/24/2014    Current Outpatient Medications on File Prior to Visit  Medication Sig Dispense Refill  . aspirin 81 MG tablet Take 81 mg by mouth daily.    . diazepam (VALIUM) 5 MG tablet Take 5 mg by mouth 3 (three) times daily as needed for anxiety.    . diphenhydrAMINE (BENADRYL) 25 mg capsule Take 1 capsule (25 mg total) by mouth every 6 (six) hours as needed. 30 capsule 0  . hyoscyamine (LEVSIN, ANASPAZ) 0.125 MG tablet Take 1 tablet by mouth as needed.    Marland Kitchen ibuprofen (ADVIL,MOTRIN) 200 MG tablet Take 800 mg by mouth every 6 (six) hours as needed for headache.    . ibuprofen (ADVIL,MOTRIN) 600 MG tablet Take 1 tablet (600 mg total) by mouth every 6 (six) hours as needed. 30 tablet 0  . meloxicam (MOBIC) 15 MG tablet Take 1 tablet (15 mg total) by mouth daily. 30 tablet 0  . metoCLOPramide (REGLAN) 10 MG tablet Take 1 tablet (10 mg total) by mouth every 6 (six) hours. 30 tablet 0  . metoprolol succinate (TOPROL-XL) 50 MG 24 hr tablet Take 50 mg by mouth daily. Take with or immediately following a meal.    . neomycin-polymyxin-hydrocortisone (CORTISPORIN) OTIC solution Apply 2 drops to the ingrown toenail site twice daily. Cover with band-aid. 10 mL 0  . pantoprazole (PROTONIX) 40 MG tablet Take 40 mg by mouth daily.    . SUMAtriptan (IMITREX) 100 MG  tablet Take 100 mg by mouth.    . topiramate (TOPAMAX) 50 MG tablet Take 1 in AM and 2 at night for 2 weeks, the 2 pills 2 times daily thereafter. 120 tablet 5  . Topiramate ER (TROKENDI XR) 200 MG CP24 Take 200 mg by mouth at bedtime.     No current facility-administered medications on file prior to visit.     No Known Allergies  Objective:  General: Alert and oriented x3 in no acute distress  Dermatology: No open lesions bilateral lower extremities, no webspace macerations, no ecchymosis bilateral, all nails x 10 are well manicured.  Vascular: Dorsalis Pedis and Posterior Tibial pedal pulses palpable, Capillary Fill Time 3 seconds,(+) pedal hair growth bilateral, no edema bilateral lower extremities, Temperature gradient within normal limits.  Neurology: Johney Maine sensation intact via light touch bilateral.  Musculoskeletal: Mild tenderness with palpation at first metatarsal phalangeal joint on left,No pain with calf compression bilateral. There is decreased ankle rom with knee extending  vs flexed resembling gastroc equnius bilateral, Subtalar joint range of motion is within normal limits, there is no 1st ray hypermobility noted bilateral, decreased 1st MPJ rom left greater than right with functional limitus noted on weightbearing exam. Strength within normal limits in all groups bilateral.   Gait: Antalgic gait  Xrays  Left foot   Impression: Normal osseous mineralization there is mild decrease of first metatarsal phalangeal joint space  no other acute findings.  Assessment and Plan: Problem List Items Addressed This Visit    None    Visit Diagnoses    Toe joint pain, left    -  Primary   Hallux limitus of left foot       Metatarsalgia of left foot       Capsulitis           -Complete examination performed -Xrays reviewed -Discussed treatement options capsulitis with hallux limitus and metatarsalgia on left foot -After oral consent and aseptic prep, injected a mixture  containing 1 ml of 2%  plain lidocaine, 1 ml 0.5% plain marcaine, 0.5 ml of kenalog 10 and 0.5 ml of dexamethasone phosphate into left first metatarsal phalangeal joint without complication. Post-injection care discussed with patient.  -Dispensed toe sleeve to use as instructed -Advised good supportive shoes and continue with Mobic as prescribed by PCP -Patient to return to office in 3 to 4 weeks or sooner if condition worsens.  At this visit we will discuss custom orthotics full-length.  Asencion Islamitorya Shamell Hittle, DPM

## 2019-04-28 NOTE — Patient Instructions (Signed)
Hallux Rigidus  Hallux rigidus is a type of joint pain or joint disease (arthritis) that affects your big toe (hallux). This condition involves the joint that connects the base of your big toe to the main part of your foot (metatarsophalangeal joint or MTP joint). This condition can cause your big toe to become stiff, painful, and difficult to move. Symptoms may get worse with movement or in cold or damp weather. The condition gets worse over time. What are the causes? This condition may be caused by having a foot that does not function the way that it should or that has an abnormal shape (structural deformity). These foot problems can run in families and may be passed down from parents to children (are hereditary). This condition can also be caused by:  Injury.  Overuse.  Certain inflammatory diseases, including gout and rheumatoid arthritis. What increases the risk? You are more likely to develop this condition if you have:  A foot bone (metatarsal) that is longer or higher than normal.  A family history of hallux rigidus.  Previously injured your big toe.  Feet that do not have a curve (arch) on the inner side of the foot. This may be called flat feet or fallen arches.  Ankles that turn in when you walk (pronation).  Rheumatoid arthritis or gout.  A job that requires you to stoop down often at work. What are the signs or symptoms? Symptoms of this condition include:  Big toe pain.  Stiffness and difficulty moving the big toe.  Swelling of the toe and surrounding area.  Bone spurs. These are bony growths that can form on the joint of the big toe.  A limp. How is this diagnosed? This condition is diagnosed based on your medical history and a physical exam. You may also have X-rays. How is this treated? This condition is treated by:  Wearing roomy, comfortable shoes that have a large toe box.  Putting orthotic devices in your shoes.  Taking pain medicines.  Having  physical therapy.  Icing the injured area.  Alternating between putting your foot in cold water and then in warm water. If your condition is severe, treatment may include:  Corticosteroid injections to relieve pain.  Surgery to remove bone spurs, fuse damaged bones together, or replace the entire joint. Follow these instructions at home: Managing pain, stiffness, and swelling   Put your feet in cold water for 30 seconds, and then in warm water for 30 seconds. Alternate between the cold and warm water for 5 minutes. Do this several times a day or as told by your health care provider.  If directed, put ice on the injured area. ? Put ice in a plastic bag. ? Place a towel between your skin and the bag. ? Leave the ice on for 20 minutes, 2-3 times a day. General instructions  Take over-the-counter and prescription medicines only as told by your health care provider.  Do not wear high heels or other restrictive footwear. Wear comfortable, supportive shoes that have a large toe box.  Wear shoe inserts (orthotics) as told by your health care provider, if this applies.  Do foot exercises as instructed by your health care provider or a physical therapist.  Keep all follow-up visits as told by your health care provider. This is important. Contact a health care provider if:  You notice bone spurs or growths on or around your big toe.  Your pain does not get better or it gets worse.  You have   pain while resting.  You have pain in other parts of your body, such as your back, hip, or knee.  You start to limp. Summary  Hallux rigidus is a condition that makes your big toe become stiff, painful, and difficult to move.  It can be caused by injury, overuse, or inflammatory diseases.  This condition may be treated with ice, medicines, physical therapy, and surgery.  Do not wear high heels or other restrictive footwear. Wear comfortable, supportive shoes that have a large toe box. This  information is not intended to replace advice given to you by your health care provider. Make sure you discuss any questions you have with your health care provider. Document Released: 10/13/2005 Document Revised: 07/23/2018 Document Reviewed: 07/26/2018 Elsevier Patient Education  2020 [employer]  

## 2019-05-02 ENCOUNTER — Ambulatory Visit: Payer: BC Managed Care – PPO | Admitting: Podiatry

## 2019-05-03 ENCOUNTER — Other Ambulatory Visit: Payer: Self-pay | Admitting: Sports Medicine

## 2019-05-03 DIAGNOSIS — M205X2 Other deformities of toe(s) (acquired), left foot: Secondary | ICD-10-CM

## 2019-05-03 DIAGNOSIS — M779 Enthesopathy, unspecified: Secondary | ICD-10-CM

## 2019-05-03 DIAGNOSIS — M79672 Pain in left foot: Secondary | ICD-10-CM

## 2019-05-18 ENCOUNTER — Encounter: Payer: Self-pay | Admitting: Sports Medicine

## 2019-05-18 ENCOUNTER — Ambulatory Visit: Payer: BC Managed Care – PPO | Admitting: Sports Medicine

## 2019-05-18 ENCOUNTER — Other Ambulatory Visit: Payer: Self-pay

## 2019-05-18 VITALS — Temp 100.3°F | Resp 16

## 2019-05-18 DIAGNOSIS — M729 Fibroblastic disorder, unspecified: Secondary | ICD-10-CM

## 2019-05-18 DIAGNOSIS — M25572 Pain in left ankle and joints of left foot: Secondary | ICD-10-CM

## 2019-05-18 DIAGNOSIS — M205X2 Other deformities of toe(s) (acquired), left foot: Secondary | ICD-10-CM

## 2019-05-18 DIAGNOSIS — M7742 Metatarsalgia, left foot: Secondary | ICD-10-CM

## 2019-05-18 NOTE — Progress Notes (Signed)
Subjective: Clayton Myers is a 38 y.o. male patient who returns to office for follow-up evaluation of left big toe joint pain.  Patient reports that after injection last visit his toe joint is doing a lot better now 1-2 out of 10 states that he had a small flareup for about a half a day and reports that after a few hours and taking his meloxicam the pain went away.  Patient is unsure that if it was because of how he was walking and standing or what he was doing at work that could have caused it to flareup but has not had any flareup since.  Patient denies any significant swelling redness warmth drainage or any other constitutional symptoms at this time.  Patient Active Problem List   Diagnosis Date Noted  . Pseudotumor cerebri 03/24/2014    Current Outpatient Medications on File Prior to Visit  Medication Sig Dispense Refill  . aspirin 81 MG tablet Take 81 mg by mouth daily.    . diazepam (VALIUM) 5 MG tablet Take 5 mg by mouth 3 (three) times daily as needed for anxiety.    . diphenhydrAMINE (BENADRYL) 25 mg capsule Take 1 capsule (25 mg total) by mouth every 6 (six) hours as needed. 30 capsule 0  . hyoscyamine (LEVSIN, ANASPAZ) 0.125 MG tablet Take 1 tablet by mouth as needed.    Marland Kitchen. ibuprofen (ADVIL,MOTRIN) 200 MG tablet Take 800 mg by mouth every 6 (six) hours as needed for headache.    . ibuprofen (ADVIL,MOTRIN) 600 MG tablet Take 1 tablet (600 mg total) by mouth every 6 (six) hours as needed. 30 tablet 0  . meloxicam (MOBIC) 15 MG tablet Take 1 tablet (15 mg total) by mouth daily. 30 tablet 0  . metoCLOPramide (REGLAN) 10 MG tablet Take 1 tablet (10 mg total) by mouth every 6 (six) hours. 30 tablet 0  . metoprolol succinate (TOPROL-XL) 50 MG 24 hr tablet Take 50 mg by mouth daily. Take with or immediately following a meal.    . neomycin-polymyxin-hydrocortisone (CORTISPORIN) OTIC solution Apply 2 drops to the ingrown toenail site twice daily. Cover with band-aid. 10 mL 0  . pantoprazole  (PROTONIX) 40 MG tablet Take 40 mg by mouth daily.    . SUMAtriptan (IMITREX) 100 MG tablet Take 100 mg by mouth.    . topiramate (TOPAMAX) 50 MG tablet Take 1 in AM and 2 at night for 2 weeks, the 2 pills 2 times daily thereafter. 120 tablet 5  . Topiramate ER (TROKENDI XR) 200 MG CP24 Take 200 mg by mouth at bedtime.    Marland Kitchen. TROKENDI XR 100 MG CP24      No current facility-administered medications on file prior to visit.     No Known Allergies  Objective:  General: Alert and oriented x3 in no acute distress  Dermatology: No open lesions bilateral lower extremities, no webspace macerations, no ecchymosis bilateral, all nails x 10 are well manicured.  Vascular: Dorsalis Pedis and Posterior Tibial pedal pulses palpable, Capillary Fill Time 3 seconds,(+) pedal hair growth bilateral, no edema bilateral lower extremities, Temperature gradient within normal limits.  Neurology: Michaell CowingGross sensation intact via light touch bilateral.  Musculoskeletal: No acute reproducible tenderness with palpation at first metatarsal phalangeal joint on left,No pain with calf compression bilateral. There is decreased ankle rom with knee extending  vs flexed resembling gastroc equnius bilateral, Subtalar joint range of motion is within normal limits, there is no 1st ray hypermobility noted bilateral, decreased 1st MPJ rom left  greater than right with functional limitus noted on weightbearing exam.  History of plantar fasciitis with no recurrence.  Strength within normal limits in all groups bilateral.   Assessment and Plan: Problem List Items Addressed This Visit    None    Visit Diagnoses    Hallux limitus of left foot    -  Primary   Metatarsalgia of left foot       Fasciitis       Toe joint pain, left           -Complete examination performed -Educated patient on progression and long-term care for hallux limitus -Patient is noted to be doing well advised patient that he may call for refill if needed on the  meloxicam -Patient was casted today for custom molded functional foot orthotics and was made aware that his orthotics are covered at a 100%; prescription sent for orthotics with [facility] for full-length with soft Morton's extension -Advised good supportive shoes and toe foam if it will work in shoes until he gets his custom insoles -Patient to return to office for pickup of orthotics or sooner problems or issues arise.  Landis Martins, DPM

## 2021-06-25 ENCOUNTER — Other Ambulatory Visit: Payer: Self-pay

## 2021-06-25 ENCOUNTER — Ambulatory Visit (INDEPENDENT_AMBULATORY_CARE_PROVIDER_SITE_OTHER): Payer: BC Managed Care – PPO

## 2021-06-25 ENCOUNTER — Ambulatory Visit: Payer: BC Managed Care – PPO | Admitting: Sports Medicine

## 2021-06-25 ENCOUNTER — Encounter: Payer: Self-pay | Admitting: Sports Medicine

## 2021-06-25 DIAGNOSIS — M779 Enthesopathy, unspecified: Secondary | ICD-10-CM

## 2021-06-25 DIAGNOSIS — M1000 Idiopathic gout, unspecified site: Secondary | ICD-10-CM | POA: Diagnosis not present

## 2021-06-25 DIAGNOSIS — K219 Gastro-esophageal reflux disease without esophagitis: Secondary | ICD-10-CM | POA: Insufficient documentation

## 2021-06-25 DIAGNOSIS — M25572 Pain in left ankle and joints of left foot: Secondary | ICD-10-CM

## 2021-06-25 MED ORDER — TRIAMCINOLONE ACETONIDE 10 MG/ML IJ SUSP
10.0000 mg | Freq: Once | INTRAMUSCULAR | Status: AC
  Administered 2021-06-25: 10 mg

## 2021-06-25 MED ORDER — PREDNISONE 10 MG (21) PO TBPK: ORAL_TABLET | ORAL | 0 refills | Status: AC

## 2021-06-25 MED ORDER — TRIAMCINOLONE ACETONIDE 10 MG/ML IJ SUSP: 10.0000 mg | Freq: Once | INTRAMUSCULAR | Status: AC

## 2021-06-25 NOTE — Progress Notes (Signed)
Subjective: Clayton Myers is a 40 y.o. male patient who presents to office for evaluation of Left foot pain. Patient complains of progressive pain especially over the last 3 weeks feels like it is flared back up like previous sharp pain hard to walk 5 out of 10 has tried ice heat IcyHot Tylenol but pain still persists.  Denies new injury/trip/fall/sprain/any causative factors.      Patient Active Problem List   Diagnosis Date Noted   GERD (gastroesophageal reflux disease) 06/25/2021   Intracranial hypertension 01/13/2017   Pseudotumor cerebri 03/24/2014    Current Outpatient Medications on File Prior to Visit  Medication Sig Dispense Refill   aspirin 81 MG tablet Take 81 mg by mouth daily.     citalopram (CELEXA) 10 MG tablet Take 10 mg by mouth daily.     diphenhydrAMINE (BENADRYL) 25 mg capsule Take 1 capsule (25 mg total) by mouth every 6 (six) hours as needed. 30 capsule 0   hyoscyamine (LEVSIN, ANASPAZ) 0.125 MG tablet Take 1 tablet by mouth as needed.     ibuprofen (ADVIL,MOTRIN) 200 MG tablet Take 800 mg by mouth every 6 (six) hours as needed for headache.     ibuprofen (ADVIL,MOTRIN) 600 MG tablet Take 1 tablet (600 mg total) by mouth every 6 (six) hours as needed. 30 tablet 0   meloxicam (MOBIC) 15 MG tablet Take 1 tablet (15 mg total) by mouth daily. 30 tablet 0   metoprolol succinate (TOPROL-XL) 50 MG 24 hr tablet Take 50 mg by mouth daily. Take with or immediately following a meal.     topiramate (TOPAMAX) 50 MG tablet Take 1 in AM and 2 at night for 2 weeks, the 2 pills 2 times daily thereafter. 120 tablet 5   Topiramate ER (TROKENDI XR) 200 MG CP24 Take 200 mg by mouth at bedtime.     TROKENDI XR 100 MG CP24      No current facility-administered medications on file prior to visit.    No Known Allergies  Objective:  General: Alert and oriented x3 in no acute distress  Dermatology: No open lesions bilateral lower extremities, no webspace macerations, no ecchymosis  bilateral, all nails x 10 are well manicured.  Vascular: Dorsalis Pedis and Posterior Tibial pedal pulses palpable, Capillary Fill Time 3 seconds,(+) pedal hair growth bilateral, no edema bilateral lower extremities, Temperature gradient within normal limits.  Neurology: Michaell Cowing sensation intact via light touch bilateral.  Musculoskeletal: Mild tenderness with palpation at first metatarsal phalangeal joint on left,No pain with calf compression bilateral. There is decreased ankle rom with knee extending  vs flexed resembling gastroc equnius bilateral, Subtalar joint range of motion is within normal limits, there is no 1st ray hypermobility noted bilateral, decreased 1st MPJ rom left greater than right with functional limitus noted on weightbearing exam. Strength within normal limits in all groups bilateral.   Gait: Antalgic gait  Xrays  Left foot   Impression: Normal osseous mineralization there is mild decrease of first metatarsal phalangeal joint space no other acute findings.  Assessment and Plan: Problem List Items Addressed This Visit   None Visit Diagnoses     Toe joint pain, left    -  Primary   Relevant Orders   DG Foot Complete Left   Capsulitis       Idiopathic gout, unspecified chronicity, unspecified site            -Complete examination performed -Xrays reviewed -Discussed treatement options capsulitis versus gout with hallux limitus and  metatarsalgia on left foot -After oral consent and aseptic prep, injected a mixture containing 1 ml of 2%  plain lidocaine, 1 ml 0.5% plain marcaine, 0.5 ml of kenalog 10 and 0.5 ml of dexamethasone phosphate into left first metatarsal phalangeal joint without complication. Post-injection care discussed with patient.  -Dispensed surgical shoe for patient to use as instructed -Rx prednisone Dosepak for patient to take as instructed -Patient has a appointment with PCP on Thursday for follow-up of GERD symptoms likely related to excessive  use of meloxicam due to joint pain -Patient to return to office if fails to improve or as scheduled or sooner if problems or issues arise  Asencion Islam, DPM

## 2021-06-26 ENCOUNTER — Encounter: Payer: Self-pay | Admitting: Sports Medicine

## 2021-06-26 ENCOUNTER — Other Ambulatory Visit: Payer: Self-pay | Admitting: Sports Medicine

## 2021-06-26 DIAGNOSIS — M25572 Pain in left ankle and joints of left foot: Secondary | ICD-10-CM

## 2021-06-26 DIAGNOSIS — M779 Enthesopathy, unspecified: Secondary | ICD-10-CM

## 2021-07-10 ENCOUNTER — Ambulatory Visit: Payer: BC Managed Care – PPO | Admitting: Sports Medicine

## 2021-09-30 ENCOUNTER — Telehealth: Payer: Self-pay | Admitting: Sports Medicine

## 2021-09-30 NOTE — Telephone Encounter (Signed)
Referral faxed lm to notify pt/reb

## 2021-09-30 NOTE — Telephone Encounter (Signed)
Pt would like to know if you can send referral to  Rheumatology on [address] [facility].  Pt states his issue is not better and he keeps tested negative on bloodwork for gout.  He tried to make appt but they need a referral.
